# Patient Record
Sex: Male | Born: 1975 | Race: Black or African American | Hispanic: No | Marital: Married | Smoking: Never smoker
Health system: Southern US, Community
[De-identification: ages and names within clinical notes are randomized; demographics above are authoritative.]

## PROBLEM LIST (undated history)

## (undated) DIAGNOSIS — R079 Chest pain, unspecified: Secondary | ICD-10-CM

## (undated) DIAGNOSIS — Z87438 Personal history of other diseases of male genital organs: Secondary | ICD-10-CM

## (undated) DIAGNOSIS — R519 Headache, unspecified: Secondary | ICD-10-CM

## (undated) DIAGNOSIS — R51 Headache: Secondary | ICD-10-CM

## (undated) DIAGNOSIS — R319 Hematuria, unspecified: Secondary | ICD-10-CM

---

## 1998-04-16 ENCOUNTER — Emergency Department (HOSPITAL_COMMUNITY): Admission: EM | Admit: 1998-04-16 | Discharge: 1998-04-16 | Payer: Self-pay | Admitting: Emergency Medicine

## 1998-11-20 ENCOUNTER — Emergency Department (HOSPITAL_COMMUNITY): Admission: EM | Admit: 1998-11-20 | Discharge: 1998-11-20 | Payer: Self-pay | Admitting: Emergency Medicine

## 2000-10-14 ENCOUNTER — Emergency Department: Admission: EM | Admit: 2000-10-14 | Discharge: 2000-10-14 | Payer: Self-pay | Admitting: *Deleted

## 2002-06-04 ENCOUNTER — Emergency Department (HOSPITAL_COMMUNITY): Admission: EM | Admit: 2002-06-04 | Discharge: 2002-06-04 | Payer: Self-pay | Admitting: Emergency Medicine

## 2006-07-31 ENCOUNTER — Emergency Department (HOSPITAL_COMMUNITY): Admission: EM | Admit: 2006-07-31 | Discharge: 2006-07-31 | Payer: Self-pay | Admitting: Family Medicine

## 2006-11-14 ENCOUNTER — Emergency Department (HOSPITAL_COMMUNITY): Admission: EM | Admit: 2006-11-14 | Discharge: 2006-11-14 | Payer: Self-pay | Admitting: Emergency Medicine

## 2006-11-16 ENCOUNTER — Emergency Department (HOSPITAL_COMMUNITY): Admission: EM | Admit: 2006-11-16 | Discharge: 2006-11-16 | Payer: Self-pay | Admitting: Family Medicine

## 2007-06-22 ENCOUNTER — Emergency Department (HOSPITAL_COMMUNITY): Admission: EM | Admit: 2007-06-22 | Discharge: 2007-06-22 | Payer: Self-pay | Admitting: Family Medicine

## 2008-05-04 ENCOUNTER — Emergency Department (HOSPITAL_COMMUNITY): Admission: EM | Admit: 2008-05-04 | Discharge: 2008-05-04 | Payer: Self-pay | Admitting: Emergency Medicine

## 2009-04-08 ENCOUNTER — Emergency Department (HOSPITAL_COMMUNITY): Admission: EM | Admit: 2009-04-08 | Discharge: 2009-04-08 | Payer: Self-pay | Admitting: Family Medicine

## 2011-03-04 LAB — POCT URINALYSIS DIP (DEVICE)
Bilirubin Urine: NEGATIVE
Glucose, UA: NEGATIVE mg/dL
Ketones, ur: NEGATIVE mg/dL
Nitrite: NEGATIVE
Protein, ur: NEGATIVE mg/dL
Specific Gravity, Urine: 1.02 (ref 1.005–1.030)
Urobilinogen, UA: 0.2 mg/dL (ref 0.0–1.0)
pH: 6.5 (ref 5.0–8.0)

## 2011-03-04 LAB — GC/CHLAMYDIA PROBE AMP, GENITAL
Chlamydia, DNA Probe: NEGATIVE
GC Probe Amp, Genital: NEGATIVE

## 2014-09-27 ENCOUNTER — Emergency Department (HOSPITAL_COMMUNITY)
Admission: EM | Admit: 2014-09-27 | Discharge: 2014-09-27 | Disposition: A | Discharge status: Home / Self Care | Payer: Self-pay | Attending: Emergency Medicine | Admitting: Emergency Medicine

## 2014-09-27 ENCOUNTER — Encounter (HOSPITAL_COMMUNITY): Payer: Self-pay | Admitting: *Deleted

## 2014-09-27 DIAGNOSIS — R519 Headache, unspecified: Secondary | ICD-10-CM

## 2014-09-27 DIAGNOSIS — R0981 Nasal congestion: Secondary | ICD-10-CM | POA: Insufficient documentation

## 2014-09-27 DIAGNOSIS — R42 Dizziness and giddiness: Secondary | ICD-10-CM | POA: Insufficient documentation

## 2014-09-27 DIAGNOSIS — R110 Nausea: Secondary | ICD-10-CM | POA: Insufficient documentation

## 2014-09-27 DIAGNOSIS — J3489 Other specified disorders of nose and nasal sinuses: Secondary | ICD-10-CM | POA: Insufficient documentation

## 2014-09-27 DIAGNOSIS — R51 Headache: Secondary | ICD-10-CM | POA: Insufficient documentation

## 2014-09-27 MED ORDER — DIPHENHYDRAMINE HCL 50 MG/ML IJ SOLN
25.0000 mg | Freq: Once | INTRAMUSCULAR | Status: AC
  Administered 2014-09-27: 25 mg via INTRAVENOUS
  Filled 2014-09-27: qty 1

## 2014-09-27 MED ORDER — PROCHLORPERAZINE EDISYLATE 5 MG/ML IJ SOLN
10.0000 mg | Freq: Once | INTRAMUSCULAR | Status: AC
  Administered 2014-09-27: 10 mg via INTRAVENOUS
  Filled 2014-09-27: qty 2

## 2014-09-27 MED ORDER — KETOROLAC TROMETHAMINE 30 MG/ML IJ SOLN
30.0000 mg | INTRAMUSCULAR | Status: AC
  Administered 2014-09-27: 30 mg via INTRAVENOUS
  Filled 2014-09-27: qty 1

## 2014-09-27 MED ORDER — SODIUM CHLORIDE 0.9 % IV BOLUS (SEPSIS)
1000.0000 mL | Freq: Once | INTRAVENOUS | Status: AC
  Administered 2014-09-27: 1000 mL via INTRAVENOUS

## 2014-09-27 NOTE — Discharge Planning (Signed)
[facility] Community Health & Eligibility Specialist  Spoke to patient regarding primary care resources and establishing care with a provider. Orange card application and instructions provided, pt was instructed to contact me once discharged for further instructions. Resource guide and my contact information also given for any future questions or concerns. No other needs identified at this time.

## 2014-09-27 NOTE — Discharge Instructions (Signed)
Please follow the directions provided. Be sure to establish care with a primary care provider to follow-up regarding this headache and for further management for future headaches. Don't hesitate to return for new, worsening, or concerning symptoms.  SEEK IMMEDIATE MEDICAL CARE IF:  Your headache becomes severe.  You have a fever.  You have a stiff neck.  You have loss of vision.  You have muscular weakness or loss of muscle control.  You start losing your balance or have trouble walking.  You feel faint or pass out.  You have severe symptoms that are different from your first symptoms.   Emergency Department Resource Guide 1) Find a Doctor and Pay Out of Pocket Although you won't have to find out who is covered by your insurance plan, it is a good idea to ask around and get recommendations. You will then need to call the office and see if the doctor you have chosen will accept you as a new patient and what types of options they offer for patients who are self-pay. Some doctors offer discounts or will set up payment plans for their patients who do not have insurance, but you will need to ask so you aren't surprised when you get to your appointment.  2) Contact Your Local Health Department Not all health departments have doctors that can see patients for sick visits, but many do, so it is worth a call to see if yours does. If you don't know where your local health department is, you can check in your phone book. The CDC also has a tool to help you locate your state's health department, and many state websites also have listings of all of their local health departments.  3) Find a Walk-in Clinic If your illness is not likely to be very severe or complicated, you may want to try a walk in clinic. These are popping up all over the country in pharmacies, drugstores, and shopping centers. They're usually staffed by nurse practitioners or physician assistants that have been trained to treat common  illnesses and complaints. They're usually fairly quick and inexpensive. However, if you have serious medical issues or chronic medical problems, these are probably not your best option.  No Primary Care Doctor: - Call Health Connect at  [phone] - they can help you locate a primary care doctor that  accepts your insurance, provides certain services, etc. - Physician Referral Service- [phone]  Chronic Pain Problems: Organization         Address  Phone   Notes  [facility]  [phone] Patients need to be referred by their primary care doctor.   Medication Assistance: Organization         Address  Phone   Notes  [facility]ication [facility] [address] [place], [place] [address] [phone]) [phone] --Must be a resident of [facility] [place] -- Must have NO insurance coverage whatsoever (no Medicaid/ Medicare, etc.) -- The pt. MUST have a primary care doctor that directs their care regularly and follows them in the community   MedAssist  [phone]   [employer]  [phone]) [phone]    Agencies that provide inexpensive medical care: Organization         Address  Phone   Notes  [facility]  [phone]) [phone]   [facility]    [phone]   [facility] [address] [place], [place] [address] [phone]   [facility] [address] [place] [phone]   Planned Parenthood    [phone]   [facility]    [phone]   [facility] and [facility]  [address], [place] Phone:  [phone], Fax:  [phone] Hours of Operation:  9 am - 6 pm, M-F.  Also accepts Medicaid/Medicare and self-pay.  [facility]  [address], [place] Phone: [phone], Fax: [phone]. Hours of Operation:  8:30 am - 5:30 pm, M-F.  Also accepts Medicaid and self-pay.  [facility] [place]  [address], [place] Phone: [phone]   [facility] [address] Natasha Bence [place], [place] [phone], Ext. [phone] Mondays & Thursdays: 7-9 AM.  First 15 patients are seen on a first come, first serve basis.    Medicaid-accepting [facility] Providers:  Organization         Address  Phone   Notes  [facility] [address], [place] [phone] Also accepts self-pay patients.  [facility] [address] Laurell Josephs [place], [place]  [phone]   [facility] [address], [place] [phone]   [facility] [address] [place] [phone]   Renaye Rakers [address], [place]   [phone] Only accepts [place] Access [place] patients after they have their name applied to their card.   Self-Pay (no insurance) in [facility]:  Organization         Address  Phone   Notes  Sickle Cell Patients, [facility] [address] [place], [place] [phone]   [facility] [address] [place], [place] [phone]   [facility]  [address], [place] [phone]   [facility]/Dr. Osei-Bonsu  [address] [place] or [address], [place] [phone] Phone number for both [place] and [place] locations is the same.  [facility] [address], [place] [phone]   [facility] [address] [place] or [address] [phone]   [facility] [address] [place] [phone], phone; [phone], fax Sees patients 1st and 3rd Saturday of every month.  Must not qualify for public or private insurance (i.e. Medicaid, Medicare, [place] Health Choice, Veterans' Benefits)  Household income should be no more than 200% of the  poverty level The clinic cannot treat you if you are pregnant or think you are pregnant  Sexually transmitted diseases are not treated at the clinic.    Dental Care: Organization         Address  Phone  Notes  [facility] [address] [place], [place] [phone] Accepts children up to age 22 who are enrolled in [place] or [place] Health Choice; pregnant women with a Medicaid card; and children who have applied for Medicaid or [place] Health Choice, but were declined, whose parents can pay a reduced fee at time of service.  [facility]  [address], [place] [phone] Accepts children up to age 36 who are enrolled in [place] or  Health Choice; pregnant women with a Medicaid card; and children who  have applied for Medicaid or [place] Health Choice, but were declined, whose parents can pay a reduced fee at time of service.  [facility]  [address], [place] [phone] Patients are seen by appointment only. Walk-ins are not accepted. [facility] will see patients 40 years of age and older. Monday - Tuesday (8am-5pm) Most Wednesdays (8:30-5pm) $30 per visit, cash only  [facility]  [address], [facility] [place] [phone] Patients are seen by appointment only. Walk-ins are not accepted. [facility] will see patients 40 years of age and older. One Wednesday Evening (Monthly: Volunteer Based).  $30 per visit, cash only  [employer] [facility]  [phone] for adults; Children under age 674, call Graduate Pediatric Dentistry at [phone]) [phone]. Children aged 344-14, please call [phone] to request a pediatric application.  Dental services are provided in all areas of dental care including fillings, crowns and bridges, complete and partial dentures, implants, gum treatment, root canals, and extractions.  Preventive care is also provided. Treatment is provided to both adults and children. Patients are selected via a lottery and there is often a waiting list.   [facility] [address], [place]  [phone] [email or web address]   [facility] [address], [place], [place] [phone], Ext. [phone] Second and Fourth Thursday of each month, opens at 6:30 AM; Clinic ends at 9 AM.  Patients are seen on a first-come first-served basis, and a limited number are seen during each clinic.   [facility]  [address], [place], [place] [phone]   Eligibility Requirements You must have lived in [place], [place], or [place] counties for at least the last three months.   You cannot be eligible for state or federal sponsored National Cityhealthcare insurance, including CIGNAVeterans Administration, [place]icaid, or [employer]Medicare.   You generally cannot be eligible for healthcare insurance through your employer.    How to apply: Eligibility screenings are held every Tuesday and Wednesday afternoon from 1:00 pm until 4:00 pm. You do not need an appointment for the interview!  [facility] [address], [place], [place] [phone]   [facility]  [phone]   [facility]  [phone]   [facility]  [phone]    Behavioral Health Resources in the Community: Intensive Outpatient Programs Organization         Address  Phone  Notes  [facility] [address], [place], [place] [phone]   [facility] [address], [place], [place] [phone]   [facility] [address], [place], [place]  [phone]   [facility] [address],  [place], [place] [phone] or [phone]   Substance Abuse Resources Organization         Address  Phone  Notes  [facility] Drug Services  [phone]   Addiction  [facility]  [phone]   [facility]  [phone]   [facility]  [phone]   Residential & Outpatient Substance Abuse Program  [phone]   Psychological Services Organization         Address  Phone  Notes  [facility]  [phone]- [phone]   [facility]  [phone]- [phone]   [facility] [address], [place] [phone] or [phone]    Mobile Crisis Teams Organization  Address  Phone  Notes  [facility], Mobile Crisis Care Unit  [phone]   [facility]  [address] [place] [address] [place], [place]   [facility] [address] [place] [phone]    Self-Help/Support Groups Organization         Address  Phone             Notes  [place]. of [place] - variety of support groups  [place] Call for more information  Narcotics Anonymous (NA), [facility] [address], [employer] [place]  2 meetings at this location   Special educational needs teacher         Address  Phone  Notes  [facility] Residential Treatment [place],    [place]  [phone]   [facility]  [address], [place] [address]68, [place], [place]   [place], [place] [phone] Admissions: 8am-3pm M-F  Incentives Substance [place] [address],    [place], [place] [phone]   [facility] [address] [place], [place], [place]   [facility] [address]  [place], [place]   [facility] - Intensive Outpatient [address]een [address], [place], [place]   [facility] ([address] [place].,  [place], [place] [phone] or [phone]   [facility] (RTS) [address], [place], [place] Accepts Medicaid  [facility] [address],  [place]  [phone] Substance Abuse/Addiction Treatment   [facility] Organization         Address  Phone  Notes  [facility]  [phone]   Domenic Schwab, PhD [address] Arlis Porta [place], [place]   [phone] or [phone]   [address] [place], [place] [phone]   [facility] [address] [place], [place] [phone] Insurance/Medicaid/sponsorship through [facility] [address], [address]                                    [place], [place] [phone] [place] [address], [place] [phone]    Dr. Adele Schilder  [phone]   [facility] 1) [address], [place] 2) [place] 3)  [address], [place] [phone]  [phone]   [place] [phone] or [phone] (After Hours)

## 2014-09-27 NOTE — ED Notes (Signed)
pts iv removed pt awaiting discharge paperwork at bedside.

## 2014-09-27 NOTE — ED Notes (Signed)
Pt reports headache x 3 days, no relief with otc meds but denies any n/v.

## 2014-09-27 NOTE — ED Provider Notes (Signed)
CSN: [id number]     Arrival date & time 09/27/14  0907 History  This chart was scribed for non-physician practitioner, Harle BattiestElizabeth Asani Mcburney, PA-C,working with Purvis SheffieldForrest Harrison, MD, by Karle PlumberJennifer Tensley, ED Scribe. This patient was seen in room TR05C/TR05C and the patient's care was started at 10:12 AM.  Chief Complaint  Patient presents with  . Headache   Patient is a 38 y.o. male presenting with headaches. The history is provided by the patient. No language interpreter was used.  Headache Associated symptoms: congestion, dizziness and nausea   Associated symptoms: no cough, no sore throat and no vomiting     HPI Comments:  Melanie CrazierDonte Flynn is a 38 y.o. male who presents to the Emergency Department complaining of a severe headache that began three days ago. He states the pain began gradually while he was washing dishes at home and is more prominent on the right side. He reports the pain is so bad it makes his eyes water. He reports associated congestion, rhinorrhea, nausea and dizziness. He has taken Aleve with no significant relief of the pain. No modifying factors reported. He denies vomiting, sore throat, cough, LOC, confusion, visual changes, weakness or inability to walk.  History reviewed. No pertinent past medical history. History reviewed. No pertinent past surgical history. History reviewed. No pertinent family history. History  Substance Use Topics  . Smoking status: Not on file  . Smokeless tobacco: Not on file  . Alcohol Use: No    Review of Systems  HENT: Positive for congestion and rhinorrhea. Negative for sore throat.   Eyes: Negative for visual disturbance.  Respiratory: Negative for cough.   Gastrointestinal: Positive for nausea. Negative for vomiting.  Musculoskeletal: Negative for gait problem.  Neurological: Positive for dizziness and headaches. Negative for syncope and weakness.  Psychiatric/Behavioral: Negative for confusion.    Allergies  Review of patient's  allergies indicates no known allergies.  Home Medications   Prior to Admission medications   Not on File   Triage Vitals: BP 115/87 mmHg  Pulse 78  Temp(Src) 98.3 F (36.8 C) (Oral)  Resp 18  SpO2 99% Physical Exam  Constitutional: He is oriented to person, place, and time. He appears well-developed and well-nourished.  HENT:  Head: Normocephalic and atraumatic.  Tender to palpation circumferentially around scalp.  Eyes: EOM are normal. Pupils are equal, round, and reactive to light.  Neck: Normal range of motion.  Cardiovascular: Normal rate.   Pulmonary/Chest: Effort normal.  Musculoskeletal: Normal range of motion. He exhibits tenderness.  Lymphadenopathy:    He has no cervical adenopathy.  Neurological: He is alert and oriented to person, place, and time. No cranial nerve deficit.  Cranial nerves 2-12 grossly intact. Grip strength and SLR 5/5 bilaterally.  Skin: Skin is warm and dry.  Psychiatric: He has a normal mood and affect. His behavior is normal.  Nursing note and vitals reviewed.   ED Course  Procedures (including critical care time) DIAGNOSTIC STUDIES: Oxygen Saturation is 99% on RA, normal by my interpretation.   COORDINATION OF CARE: 10:17 AM- Will start IV and give Toradol, Compazine and Benadryl. Pt verbalizes understanding and agrees to plan.  Labs Review Labs Reviewed - No data to display  Imaging Review No results found.   EKG Interpretation None      MDM   Final diagnoses:  Nonintractable headache, unspecified chronicity pattern, unspecified headache type   38 yo male with headache non-concerning for [facility], ICH, meningitis or temporal arteritis.  Presentation is like pts typical HA.  Pt is afebrile with no focal neuro deficits, nuchal rigidity, or change in vision. Pain managed in the ED. Discharge instructions include resources to establish care with a PCP to discuss prophylactic medication. Pt verbalizes understanding and is agreeable with  plan to dc.    I personally performed the services described in this documentation, which was scribed in my presence. The recorded information has been reviewed and is accurate.  Filed Vitals:   09/27/14 0922 09/27/14 1328 09/27/14 1340  BP: 115/87 88/55 105/88  Pulse: 78 66   Temp: 98.3 F (36.8 C)    TempSrc: Oral    Resp: 18 18   SpO2: 99% 99%     Meds given in ED:  Medications  sodium chloride 0.9 % bolus 1,000 mL (0 mLs Intravenous Stopped 09/27/14 1356)  ketorolac (TORADOL) 30 MG/ML injection 30 mg (30 mg Intravenous Given 09/27/14 1120)  prochlorperazine (COMPAZINE) injection 10 mg (10 mg Intravenous Given 09/27/14 1127)  diphenhydrAMINE (BENADRYL) injection 25 mg (25 mg Intravenous Given 09/27/14 1121)    There are no discharge medications for this patient.     Harle BattiestElizabeth Anaisa Radi, NP 09/28/14 2241  Purvis SheffieldForrest Harrison, MD 09/29/14 1416

## 2014-10-06 ENCOUNTER — Encounter (HOSPITAL_COMMUNITY): Payer: Self-pay | Admitting: Emergency Medicine

## 2014-10-06 ENCOUNTER — Emergency Department (HOSPITAL_COMMUNITY)
Admission: EM | Admit: 2014-10-06 | Discharge: 2014-10-07 | Disposition: A | Discharge status: Home / Self Care | Payer: Self-pay | Attending: Emergency Medicine | Admitting: Emergency Medicine

## 2014-10-06 ENCOUNTER — Emergency Department (HOSPITAL_COMMUNITY): Payer: Self-pay

## 2014-10-06 DIAGNOSIS — Z79899 Other long term (current) drug therapy: Secondary | ICD-10-CM | POA: Insufficient documentation

## 2014-10-06 DIAGNOSIS — Z7982 Long term (current) use of aspirin: Secondary | ICD-10-CM | POA: Insufficient documentation

## 2014-10-06 DIAGNOSIS — R0602 Shortness of breath: Secondary | ICD-10-CM | POA: Insufficient documentation

## 2014-10-06 DIAGNOSIS — Z87438 Personal history of other diseases of male genital organs: Secondary | ICD-10-CM | POA: Insufficient documentation

## 2014-10-06 DIAGNOSIS — R079 Chest pain, unspecified: Secondary | ICD-10-CM | POA: Insufficient documentation

## 2014-10-06 DIAGNOSIS — Z87448 Personal history of other diseases of urinary system: Secondary | ICD-10-CM | POA: Insufficient documentation

## 2014-10-06 DIAGNOSIS — I4510 Unspecified right bundle-branch block: Secondary | ICD-10-CM | POA: Insufficient documentation

## 2014-10-06 DIAGNOSIS — J9811 Atelectasis: Secondary | ICD-10-CM | POA: Insufficient documentation

## 2014-10-06 DIAGNOSIS — R51 Headache: Secondary | ICD-10-CM | POA: Insufficient documentation

## 2014-10-06 HISTORY — DX: Hematuria, unspecified: R31.9

## 2014-10-06 HISTORY — DX: Headache: R51

## 2014-10-06 HISTORY — DX: Headache, unspecified: R51.9

## 2014-10-06 HISTORY — DX: Chest pain, unspecified: R07.9

## 2014-10-06 HISTORY — DX: Personal history of other diseases of male genital organs: Z87.438

## 2014-10-06 LAB — BASIC METABOLIC PANEL
Anion gap: 12 (ref 5–15)
BUN: 13 mg/dL (ref 6–23)
CO2: 25 meq/L (ref 19–32)
CREATININE: 1.44 mg/dL — AB (ref 0.50–1.35)
Calcium: 9.5 mg/dL (ref 8.4–10.5)
Chloride: 102 mEq/L (ref 96–112)
GFR calc Af Amer: 69 mL/min — ABNORMAL LOW (ref >90)
GFR calc non Af Amer: 60 mL/min — ABNORMAL LOW (ref >90)
GLUCOSE: 83 mg/dL (ref 70–99)
Potassium: 3.9 mEq/L (ref 3.7–5.3)
Sodium: 139 mEq/L (ref 137–147)

## 2014-10-06 LAB — CBC
HCT: 38.2 % — ABNORMAL LOW (ref 39.0–52.0)
Hemoglobin: 12.9 g/dL — ABNORMAL LOW (ref 13.0–17.0)
MCH: 30.1 pg (ref 26.0–34.0)
MCHC: 33.8 g/dL (ref 30.0–36.0)
MCV: 89.3 fL (ref 78.0–100.0)
Platelets: 322 10*3/uL (ref 150–400)
RBC: 4.28 MIL/uL (ref 4.22–5.81)
RDW: 12.1 % (ref 11.5–15.5)
WBC: 5.2 10*3/uL (ref 4.0–10.5)

## 2014-10-06 LAB — I-STAT TROPONIN, ED: Troponin i, poc: 0 ng/mL (ref 0.00–0.08)

## 2014-10-06 NOTE — ED Provider Notes (Signed)
CSN: [id number]     Arrival date & time 10/06/14  1921 History   First MD Initiated Contact with Patient 10/06/14 2232     Chief Complaint  Patient presents with  . Chest Pain     (Consider location/radiation/quality/duration/timing/severity/associated sxs/prior Treatment) HPI Comments: This is a 38 y/o male who presents to the Emergency Department with his wife complaining of intermittent chest pain x 3-6 months. His chest pain is described as a squeezing pressure in the left side of his chest with associated shortness of breath that lasts 15-20 minutes. He states that occasionally he will get "hot flashes" with his chest pain episodes. Around 3am today, he was awoken from sleep with chest pain. He states he took a shower and his wife gave him one aspirin and one naproxen and he then fell back asleep. He later went to pick up his wife from work at a doctors's office and she performed an EKG where a RBBB was found and it was recommended by the doctor there to go to the ED. Pt reports he would not have come to the ED if his wife did not bring him. Non-smoker. His sister has a hx of early heart disease. He does a lot of weight lifting and is unsure if that could be the cause of his pain. Denies numbness, weakness, n/v.  Patient is a 38 y.o. male presenting with chest pain. The history is provided by the patient and the spouse.  Chest Pain   Past Medical History  Diagnosis Date  . Chest pain   . History of penile discharge   . Hematuria   . Headache    History reviewed. No pertinent past surgical history. No family history on file. History  Substance Use Topics  . Smoking status: Not on file  . Smokeless tobacco: Not on file  . Alcohol Use: No    Review of Systems  10 Systems reviewed and are negative for acute change except as noted in the HPI.  Allergies  Review of patient's allergies indicates no known allergies.  Home Medications   Prior to Admission medications    Medication Sig Start Date End Date Taking? Authorizing Provider  aspirin 325 MG tablet Take 325 mg by mouth once.    Yes Historical Provider, MD  HYDROcodone-acetaminophen (NORCO/VICODIN) 5-325 MG per tablet Take 1 tablet by mouth every 6 (six) hours as needed for moderate pain.   Yes Historical Provider, MD   BP 132/82 mmHg  Pulse 67  Temp(Src) 98.4 F (36.9 C)  Resp 26  Wt 209 lb 4 oz (94.915 kg)  SpO2 98% Physical Exam  Constitutional: He is oriented to person, place, and time. He appears well-developed and well-nourished. No distress.  HENT:  Head: Normocephalic and atraumatic.  Mouth/Throat: Oropharynx is clear and moist.  Eyes: Conjunctivae and EOM are normal. Pupils are equal, round, and reactive to light.  Neck: Normal range of motion. Neck supple. No JVD present.  Cardiovascular: Normal rate, regular rhythm, normal heart sounds and intact distal pulses.   No extremity edema.  Pulmonary/Chest: Effort normal and breath sounds normal. No respiratory distress.  Abdominal: Soft. Bowel sounds are normal. There is no tenderness.  Musculoskeletal: Normal range of motion. He exhibits no edema.  Neurological: He is alert and oriented to person, place, and time. He has normal strength. No sensory deficit.  Speech fluent, goal oriented. Moves limbs without ataxia. Equal grip strength bilateral.  Skin: Skin is warm and dry. He is not diaphoretic.  Psychiatric: He has a normal mood and affect. His behavior is normal.  Nursing note and vitals reviewed.   ED Course  Procedures (including critical care time) Labs Review Labs Reviewed  CBC - Abnormal; Notable for the following:    Hemoglobin 12.9 (*)    HCT 38.2 (*)    All other components within normal limits  BASIC METABOLIC PANEL - Abnormal; Notable for the following:    Creatinine, Ser 1.44 (*)    GFR calc non Af Amer 60 (*)    GFR calc Af Amer 69 (*)    All other components within normal limits  I-STAT TROPOININ, ED     Imaging Review Dg Chest 2 View  10/06/2014   CLINICAL DATA:  All over chest pain with cough and shortness of breath. Sore throat for 1 week.  EXAM: CHEST  2 VIEW  COMPARISON:  Chest radiograph 05/04/2008  FINDINGS: The heart size and mediastinal contours are within normal limits. There is streaky left basilar atelectasis. Otherwise, the lungs are clear. Negative for pneumothorax or pleural effusion. The visualized skeletal structures are unremarkable.  IMPRESSION: Mild left basal atelectasis. Otherwise, no acute findings identified.   Electronically Signed   By: Britta MccreedySusan  Turner M.D.   On: 10/06/2014 20:14     EKG Interpretation   Date/Time:  Friday October 06 2014 19:22:36 EST Ventricular Rate:  74 PR Interval:  148 QRS Duration: 98 QT Interval:  370 QTC Calculation: 410 R Axis:   -31 Text Interpretation:  Normal sinus rhythm Left axis deviation Incomplete  right bundle branch block Abnormal ECG No old tracing to compare Confirmed  by GOLDSTON  MD, SCOTT ([id number]) on 10/06/2014 10:30:49 PM      MDM   Final diagnoses:  Chest pain, unspecified chest pain type   Patient presenting with chest pain over the past 3-6 months. He is nontoxic appearing and in no apparent distress. Vital signs stable. EKG showing normal sinus rhythm with left axis deviation, incomplete right bundle branch block. No old EKG to compare. Troponin negative. Chest x-ray without any acute findings. Mild left basilar atelectasis. Doubt cardiac. Symptoms have been ongoing for months. Low risk heart score 2. PERC negative. Resources given for PCP follow-up. His wife states she works with cardiologists who she will try to get him an appointment with. He is stable for discharge. Return precautions given. Patient states understanding of treatment care plan and is agreeable.  Kathrynn SpeedRobyn M Dejay Kronk, PA-C 10/06/14 2357  Audree CamelScott T Goldston, MD 10/07/14 [phone]

## 2014-10-06 NOTE — ED Notes (Signed)
Pt reports chest intermittent chest pain x 6 moths. Denies CP at this time. Denies nausea, vomiting, dizziness. Pain does not increase with movement or activity. Noting makes it better. Pt alert and oriented x 4, neuro intact. Pt is hard of hearing.

## 2014-10-06 NOTE — ED Notes (Signed)
Pt reports sob for months and early this morning he was waken up with mid to left sided chest pressure radiating to arms. Pt also reports headaches. Pt seen at PCP today and was told he had an abnormal ekg. No distress noted. Skin warm and dry. resp e/u.

## 2014-10-06 NOTE — Discharge Instructions (Signed)

## 2015-04-14 ENCOUNTER — Emergency Department (HOSPITAL_COMMUNITY)
Admission: EM | Admit: 2015-04-14 | Discharge: 2015-04-14 | Disposition: A | Discharge status: Home / Self Care | Payer: BLUE CROSS/BLUE SHIELD | Attending: Emergency Medicine | Admitting: Emergency Medicine

## 2015-04-14 ENCOUNTER — Encounter (HOSPITAL_COMMUNITY): Payer: Self-pay | Admitting: Emergency Medicine

## 2015-04-14 DIAGNOSIS — Z7982 Long term (current) use of aspirin: Secondary | ICD-10-CM | POA: Diagnosis not present

## 2015-04-14 DIAGNOSIS — M5412 Radiculopathy, cervical region: Secondary | ICD-10-CM | POA: Diagnosis not present

## 2015-04-14 DIAGNOSIS — Z87448 Personal history of other diseases of urinary system: Secondary | ICD-10-CM | POA: Insufficient documentation

## 2015-04-14 DIAGNOSIS — R531 Weakness: Secondary | ICD-10-CM | POA: Diagnosis present

## 2015-04-14 MED ORDER — IBUPROFEN 600 MG PO TABS: 600.0000 mg | ORAL_TABLET | Freq: Four times a day (QID) | ORAL | Status: DC | PRN

## 2015-04-14 MED ORDER — HYDROCODONE-ACETAMINOPHEN 5-325 MG PO TABS: 1.0000 | ORAL_TABLET | Freq: Four times a day (QID) | ORAL | Status: DC | PRN

## 2015-04-14 MED ORDER — CYCLOBENZAPRINE HCL 10 MG PO TABS: 10.0000 mg | ORAL_TABLET | Freq: Two times a day (BID) | ORAL | Status: DC | PRN

## 2015-04-14 NOTE — ED Notes (Signed)
MD Beaton at bedside. 

## 2015-04-14 NOTE — ED Provider Notes (Signed)
CSN: [id number]     Arrival date & time 04/14/15  1210 History   First MD Initiated Contact with Patient 04/14/15 1216     Chief Complaint  Patient presents with  . Extremity Weakness    right arm      HPI Patient presents with pain to right arm especially with abduction.  Patient noted numbness this morning when he woke up.  Felt it was somewhat weak.  Denies any speech problems denies any leg problems.  Denies fever chills. Past Medical History  Diagnosis Date  . Chest pain   . History of penile discharge   . Hematuria   . Headache    History reviewed. No pertinent past surgical history. History reviewed. No pertinent family history. History  Substance Use Topics  . Smoking status: Never Smoker   . Smokeless tobacco: Not on file  . Alcohol Use: No    Review of Systems  All other systems reviewed and are negative  Allergies  Review of patient's allergies indicates no known allergies.  Home Medications   Prior to Admission medications   Medication Sig Start Date End Date Taking? Authorizing Provider  aspirin 325 MG tablet Take 325 mg by mouth once.     Historical Provider, MD  cyclobenzaprine (FLEXERIL) 10 MG tablet Take 1 tablet (10 mg total) by mouth 2 (two) times daily as needed for muscle spasms. 04/14/15   Nelva Nayobert Milinda Sweeney, MD  HYDROcodone-acetaminophen (NORCO/VICODIN) 5-325 MG per tablet Take 1 tablet by mouth every 6 (six) hours as needed for moderate pain. 04/14/15   Nelva Nayobert Jakory Matsuo, MD  ibuprofen (ADVIL,MOTRIN) 600 MG tablet Take 1 tablet (600 mg total) by mouth every 6 (six) hours as needed. 04/14/15   Nelva Nayobert Benton Tooker, MD   BP 169/91 mmHg  Pulse 93  Temp(Src) 98.5 F (36.9 C) (Oral)  Resp 17  Ht 5\' 11"  (1.803 m)  Wt 198 lb (89.812 kg)  BMI 27.63 kg/m2  SpO2 100% Physical Exam  Neck:     Physical Exam  Nursing note and vitals reviewed. Constitutional: He is oriented to person, place, and time. He appears well-developed and well-nourished. No distress.    HENT:  Head: Normocephalic and atraumatic.  Eyes: Pupils are equal, round, and reactive to light.  Neck: Tenderness to palpation were indicated on chart.   Cardiovascular: Normal rate and intact distal pulses.   Pulmonary/Chest: No respiratory distress.  Abdominal: Normal appearance. He exhibits no distension.  Musculoskeletal: Normal range of motion.  Neurological: He is alert and oriented to person, place, and time. No cranial nerve deficit.  Gait is normal.  GCS = 15.  Sensory-no deficits noted.  Cerebellar negative.  Cranial nerves II through XII are negative.   Skin: Skin is warm and dry. No rash noted.  Psychiatric: He has a normal mood and affect. His behavior is normal.   ED Course  Procedures (including critical care time) Labs Review Labs Reviewed - No data to display     MDM   Final diagnoses:  Cervical radiculopathy        Nelva Nayobert Treon Kehl, MD 04/14/15 1355

## 2015-04-14 NOTE — ED Notes (Addendum)
Patient coming from home with right arm pain x 1 week.  Patient states the arm will be numb and the hand has "pins and needles feeling".  Denies injury to the right arm.

## 2015-04-14 NOTE — Discharge Instructions (Signed)

## 2015-05-25 ENCOUNTER — Emergency Department (HOSPITAL_COMMUNITY)
Admission: EM | Admit: 2015-05-25 | Discharge: 2015-05-25 | Disposition: A | Discharge status: Home / Self Care | Payer: BLUE CROSS/BLUE SHIELD | Attending: Emergency Medicine | Admitting: Emergency Medicine

## 2015-05-25 ENCOUNTER — Encounter (HOSPITAL_COMMUNITY): Payer: Self-pay

## 2015-05-25 DIAGNOSIS — Z23 Encounter for immunization: Secondary | ICD-10-CM | POA: Insufficient documentation

## 2015-05-25 DIAGNOSIS — Y9289 Other specified places as the place of occurrence of the external cause: Secondary | ICD-10-CM | POA: Diagnosis not present

## 2015-05-25 DIAGNOSIS — X58XXXA Exposure to other specified factors, initial encounter: Secondary | ICD-10-CM | POA: Diagnosis not present

## 2015-05-25 DIAGNOSIS — Y998 Other external cause status: Secondary | ICD-10-CM | POA: Diagnosis not present

## 2015-05-25 DIAGNOSIS — S0591XA Unspecified injury of right eye and orbit, initial encounter: Secondary | ICD-10-CM | POA: Diagnosis present

## 2015-05-25 DIAGNOSIS — Y9389 Activity, other specified: Secondary | ICD-10-CM | POA: Diagnosis not present

## 2015-05-25 MED ORDER — ERYTHROMYCIN 5 MG/GM OP OINT
TOPICAL_OINTMENT | Freq: Once | OPHTHALMIC | Status: AC
  Administered 2015-05-25: 1 via OPHTHALMIC
  Filled 2015-05-25: qty 3.5

## 2015-05-25 MED ORDER — TETRACAINE HCL 0.5 % OP SOLN
2.0000 [drp] | Freq: Once | OPHTHALMIC | Status: AC
  Administered 2015-05-25: 2 [drp] via OPHTHALMIC
  Filled 2015-05-25: qty 2

## 2015-05-25 MED ORDER — FLUORESCEIN SODIUM 1 MG OP STRP
1.0000 | ORAL_STRIP | Freq: Once | OPHTHALMIC | Status: AC
  Administered 2015-05-25: 1 via OPHTHALMIC
  Filled 2015-05-25: qty 1

## 2015-05-25 MED ORDER — TETANUS-DIPHTH-ACELL PERTUSSIS 5-2.5-18.5 LF-MCG/0.5 IM SUSP
0.5000 mL | Freq: Once | INTRAMUSCULAR | Status: AC
  Administered 2015-05-25: 0.5 mL via INTRAMUSCULAR
  Filled 2015-05-25: qty 0.5
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
## 2015-05-25 NOTE — ED Notes (Signed)
Pt presents with c/o right eye irritation. Pt reports he was weed eating and he believes he may have gotten a small rock in his eye. Pt's eye is very irritation and red at this time, reporting some blurred vision.

## 2015-05-25 NOTE — Discharge Instructions (Signed)
Apply ointment to the lower eyelid of your eye every 6-8 hrs while awake for the next 5 days.  Take tylenol or ibuprofen at home for pain.  Follow up with eye specialist for further care.  Return to ER if your condition worsen or if you have other concerns.  Corneal Abrasion The cornea is the clear covering at the front and center of the eye. When looking at the colored portion of the eye (iris), you are looking through the cornea. This very thin tissue is made up of many layers. The surface layer is a single layer of cells (corneal epithelium) and is one of the most sensitive tissues in the body. If a scratch or injury causes the corneal epithelium to come off, it is called a corneal abrasion. If the injury extends to the tissues below the epithelium, the condition is called a corneal ulcer. CAUSES   Scratches.  Trauma.  Foreign body in the eye. Some people have recurrences of abrasions in the area of the original injury even after it has healed (recurrent erosion syndrome). Recurrent erosion syndrome generally improves and goes away with time. SYMPTOMS   Eye pain.  Difficulty or inability to keep the injured eye open.  The eye becomes very sensitive to light.  Recurrent erosions tend to happen suddenly, first thing in the morning, usually after waking up and opening the eye. DIAGNOSIS  Your health care provider can diagnose a corneal abrasion during an eye exam. Dye is usually placed in the eye using a drop or a small paper strip moistened by your tears. When the eye is examined with a special light, the abrasion shows up clearly because of the dye. TREATMENT   Small abrasions may be treated with antibiotic drops or ointment alone.  A pressure patch may be put over the eye. If this is done, follow your doctor's instructions for when to remove the patch. Do not drive or use machines while the eye patch is on. Judging distances is hard to do with a patch on. If the abrasion becomes  infected and spreads to the deeper tissues of the cornea, a corneal ulcer can result. This is serious because it can cause corneal scarring. Corneal scars interfere with light passing through the cornea and cause a loss of vision in the involved eye. HOME CARE INSTRUCTIONS  Use medicine or ointment as directed. Only take over-the-counter or prescription medicines for pain, discomfort, or fever as directed by your health care provider.  Do not drive or operate machinery if your eye is patched. Your ability to judge distances is impaired.  If your health care provider has given you a follow-up appointment, it is very important to keep that appointment. Not keeping the appointment could result in a severe eye infection or permanent loss of vision. If there is any problem keeping the appointment, let your health care provider know. SEEK MEDICAL CARE IF:   You have pain, light sensitivity, and a scratchy feeling in one eye or both eyes.  Your pressure patch keeps loosening up, and you can blink your eye under the patch after treatment.  Any kind of discharge develops from the eye after treatment or if the lids stick together in the morning.  You have the same symptoms in the morning as you did with the original abrasion days, weeks, or months after the abrasion healed. MAKE SURE YOU:   Understand these instructions.  Will watch your condition.  Will get help right away if you are not  doing well or get worse. Document Released: 11/07/2000 Document Revised: 11/15/2013 Document Reviewed: 07/18/2013 [facility] Patient Information 2015 [place], [place]. This information is not intended to replace advice given to you by your health care provider. Make sure you discuss any questions you have with your health care provider.

## 2015-08-14 ENCOUNTER — Encounter (HOSPITAL_COMMUNITY): Payer: Self-pay

## 2015-08-14 DIAGNOSIS — S59901A Unspecified injury of right elbow, initial encounter: Secondary | ICD-10-CM | POA: Diagnosis present

## 2015-08-14 DIAGNOSIS — S40812A Abrasion of left upper arm, initial encounter: Secondary | ICD-10-CM | POA: Insufficient documentation

## 2015-08-14 DIAGNOSIS — Y9389 Activity, other specified: Secondary | ICD-10-CM | POA: Diagnosis not present

## 2015-08-14 DIAGNOSIS — S53401A Unspecified sprain of right elbow, initial encounter: Secondary | ICD-10-CM | POA: Diagnosis not present

## 2015-08-14 DIAGNOSIS — S8391XA Sprain of unspecified site of right knee, initial encounter: Secondary | ICD-10-CM | POA: Diagnosis not present

## 2015-08-14 DIAGNOSIS — S50812A Abrasion of left forearm, initial encounter: Secondary | ICD-10-CM | POA: Diagnosis not present

## 2015-08-14 DIAGNOSIS — S40811A Abrasion of right upper arm, initial encounter: Secondary | ICD-10-CM | POA: Diagnosis not present

## 2015-08-14 DIAGNOSIS — Y998 Other external cause status: Secondary | ICD-10-CM | POA: Insufficient documentation

## 2015-08-14 DIAGNOSIS — S80211A Abrasion, right knee, initial encounter: Secondary | ICD-10-CM | POA: Diagnosis not present

## 2015-08-14 DIAGNOSIS — Z87438 Personal history of other diseases of male genital organs: Secondary | ICD-10-CM | POA: Insufficient documentation

## 2015-08-14 DIAGNOSIS — Y92410 Unspecified street and highway as the place of occurrence of the external cause: Secondary | ICD-10-CM | POA: Insufficient documentation
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
## 2015-08-15 ENCOUNTER — Emergency Department (HOSPITAL_COMMUNITY)
Admission: EM | Admit: 2015-08-15 | Discharge: 2015-08-15 | Disposition: A | Discharge status: Home / Self Care | Payer: BLUE CROSS/BLUE SHIELD | Attending: Emergency Medicine | Admitting: Emergency Medicine

## 2015-08-15 ENCOUNTER — Emergency Department (HOSPITAL_COMMUNITY): Payer: BLUE CROSS/BLUE SHIELD

## 2015-08-15 DIAGNOSIS — S8391XA Sprain of unspecified site of right knee, initial encounter: Secondary | ICD-10-CM

## 2015-08-15 DIAGNOSIS — S53401A Unspecified sprain of right elbow, initial encounter: Secondary | ICD-10-CM

## 2015-08-15 DIAGNOSIS — T07XXXA Unspecified multiple injuries, initial encounter: Secondary | ICD-10-CM

## 2015-08-15 MED ORDER — OXYCODONE-ACETAMINOPHEN 5-325 MG PO TABS
2.0000 | ORAL_TABLET | Freq: Once | ORAL | Status: AC
  Administered 2015-08-15: 2 via ORAL
  Filled 2015-08-15: qty 2
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
## 2015-08-15 NOTE — Discharge Instructions (Signed)

## 2015-08-15 NOTE — Progress Notes (Signed)
Orthopedic Tech Progress Note Patient Details:  Lance Gomez 1976/03/14 [id number]  Ortho Devices Type of Ortho Device: Arm sling Ortho Device/Splint Location: RUE Ortho Device/Splint Interventions: Application   Asia R Thompson 08/15/2015, 4:31 AM

## 2015-11-14 ENCOUNTER — Encounter (HOSPITAL_COMMUNITY): Payer: Self-pay | Admitting: *Deleted

## 2015-11-14 ENCOUNTER — Emergency Department (HOSPITAL_COMMUNITY): Payer: BLUE CROSS/BLUE SHIELD

## 2015-11-14 ENCOUNTER — Emergency Department (HOSPITAL_COMMUNITY)
Admission: EM | Admit: 2015-11-14 | Discharge: 2015-11-14 | Disposition: A | Discharge status: Home / Self Care | Payer: BLUE CROSS/BLUE SHIELD | Attending: Emergency Medicine | Admitting: Emergency Medicine

## 2015-11-14 DIAGNOSIS — Z87438 Personal history of other diseases of male genital organs: Secondary | ICD-10-CM | POA: Insufficient documentation

## 2015-11-14 DIAGNOSIS — K648 Other hemorrhoids: Secondary | ICD-10-CM | POA: Insufficient documentation

## 2015-11-14 DIAGNOSIS — Z79899 Other long term (current) drug therapy: Secondary | ICD-10-CM | POA: Insufficient documentation

## 2015-11-14 DIAGNOSIS — K649 Unspecified hemorrhoids: Secondary | ICD-10-CM

## 2015-11-14 DIAGNOSIS — R200 Anesthesia of skin: Secondary | ICD-10-CM

## 2015-11-14 DIAGNOSIS — R202 Paresthesia of skin: Secondary | ICD-10-CM | POA: Insufficient documentation

## 2015-11-14 LAB — CBC WITH DIFFERENTIAL/PLATELET
BASOS ABS: 0 10*3/uL (ref 0.0–0.1)
Basophils Relative: 1 %
EOS PCT: 3 %
Eosinophils Absolute: 0.1 10*3/uL (ref 0.0–0.7)
HCT: 42.4 % (ref 39.0–52.0)
HEMOGLOBIN: 14 g/dL (ref 13.0–17.0)
Lymphocytes Relative: 32 %
Lymphs Abs: 1.4 10*3/uL (ref 0.7–4.0)
MCH: 30 pg (ref 26.0–34.0)
MCHC: 33 g/dL (ref 30.0–36.0)
MCV: 91 fL (ref 78.0–100.0)
Monocytes Absolute: 0.6 10*3/uL (ref 0.1–1.0)
Monocytes Relative: 12 %
Neutro Abs: 2.4 10*3/uL (ref 1.7–7.7)
Neutrophils Relative %: 52 %
PLATELETS: 286 10*3/uL (ref 150–400)
RBC: 4.66 MIL/uL (ref 4.22–5.81)
RDW: 12.2 % (ref 11.5–15.5)
WBC: 4.5 10*3/uL (ref 4.0–10.5)

## 2015-11-14 LAB — BASIC METABOLIC PANEL
ANION GAP: 7 (ref 5–15)
BUN: 10 mg/dL (ref 6–20)
CHLORIDE: 105 mmol/L (ref 101–111)
CO2: 29 mmol/L (ref 22–32)
Calcium: 9.5 mg/dL (ref 8.9–10.3)
Creatinine, Ser: 1.32 mg/dL — ABNORMAL HIGH (ref 0.61–1.24)
GFR calc Af Amer: >60 mL/min (ref >60)
GFR calc non Af Amer: >60 mL/min (ref >60)
GLUCOSE: 106 mg/dL — AB (ref 65–99)
POTASSIUM: 3.8 mmol/L (ref 3.5–5.1)
SODIUM: 141 mmol/L (ref 135–145)

## 2015-11-14 LAB — POC OCCULT BLOOD, ED: FECAL OCCULT BLD: POSITIVE — AB

## 2015-11-14 MED ORDER — HYDROCORTISONE ACETATE 25 MG RE SUPP: 25.0000 mg | Freq: Two times a day (BID) | RECTAL | Status: DC

## 2015-11-14 MED ORDER — IBUPROFEN 800 MG PO TABS: 800.0000 mg | ORAL_TABLET | Freq: Three times a day (TID) | ORAL | Status: DC

## 2015-11-14 NOTE — Discharge Instructions (Signed)
You have been seen today for 2 separate complaints: Rectal bleeding and tingling in your arm. Your imaging and lab tests showed no abnormalities. Follow up with PCP as needed. Return to ED should symptoms worsen. Follow-up with orthopedics on your arm tingling as soon as possible. Follow-up with general surgery on the rectal bleeding should your symptoms not resolve. Be sure to increase the amount of fiber in your diet, drink plenty of fluids, and try to move around as much as possible during the day. Sitz baths may help ease any discomfort.   Emergency Department Resource Guide 1) Find a Doctor and Pay Out of Pocket Although you won't have to find out who is covered by your insurance plan, it is a good idea to ask around and get recommendations. You will then need to call the office and see if the doctor you have chosen will accept you as a new patient and what types of options they offer for patients who are self-pay. Some doctors offer discounts or will set up payment plans for their patients who do not have insurance, but you will need to ask so you aren't surprised when you get to your appointment.  2) Contact Your Local Health Department Not all health departments have doctors that can see patients for sick visits, but many do, so it is worth a call to see if yours does. If you don't know where your local health department is, you can check in your phone book. The CDC also has a tool to help you locate your state's health department, and many state websites also have listings of all of their local health departments.  3) Find a Walk-in Clinic If your illness is not likely to be very severe or complicated, you may want to try a walk in clinic. These are popping up all over the country in pharmacies, drugstores, and shopping centers. They're usually staffed by nurse practitioners or physician assistants that have been trained to treat common illnesses and complaints. They're usually fairly quick and  inexpensive. However, if you have serious medical issues or chronic medical problems, these are probably not your best option.  No Primary Care Doctor: - Call Health Connect at  [phone] - they can help you locate a primary care doctor that  accepts your insurance, provides certain services, etc. - Physician Referral Service- [phone]  Chronic Pain Problems: Organization         Address  Phone   Notes  [facility]  [phone] Patients need to be referred by their primary care doctor.   Medication Assistance: Organization         Address  Phone   Notes  [facility] Medication [facility] [address] [place], [place] [phone] --Must be a resident of [facility] [place] -- Must have NO insurance coverage whatsoever (no Medicaid/ Medicare, etc.) -- The pt. MUST have a primary care doctor that directs their care regularly and follows them in the community   MedAssist  [phone]   [employer]  [phone]    Agencies that provide inexpensive medical care: Organization         Address  Phone   Notes  [facility]  [phone]   [facility]    [phone]   [facility] [address] [place], [place] [address] [phone]   [facility] [address], [place] [phone]  Planned Parenthood    [phone]   [facility]    [phone]   [facility] and [address], [place] Phone:  [phone], Fax:  [phone] Hours of Operation:  9 am - 6 pm, M-F.  Also accepts Medicaid/Medicare and self-pay.  [facility] for [place], [address], [place] Phone: [phone], Fax: [phone]. Hours of Operation:  8:30 am - 5:30 pm, M-F.  Also accepts Medicaid and self-pay.  [facility] [place] [address] [place] Phone: [phone]   [place], [place], [place] [phone], Ext. [phone] Mondays & Thursdays: 7-9 AM.  First 15 patients are seen on a first come, first serve basis.    [place] Providers:  Organization         Address  Phone   Notes  [facility] [address], [place] [phone] Also accepts self-pay patients.  [facility] [address], [place]  [phone]   [place], [address], [place] [phone]   [facility] [address] [place] [phone]   Lucianne Lei [address], [place]   [phone] Only accepts [place] Access [place] patients after they have their name applied to their card.   Self-Pay (no insurance) in [facility]:  Organization         Address  Phone   Notes  Sickle Cell Patients, [facility] [place] [phone]   [facility] [place] [phone]   [facility] [place]  [place], [place], [place] [phone]   [facility]/Dr. Osei-Bonsu  [address] [place] or [address], [place] [phone] Phone number for both [place] and [place] locations is the same.  [facility] [place] [address] [place] [phone]   [facility] [address] [place] or [address] [phone]   [facility] [address], [place] [phone], phone; [phone], fax Sees patients 1st and 3rd Saturday of every month.  Must not qualify for public or private insurance (i.e. Medicaid, Medicare, [place] Health Choice, Veterans' Benefits)  Household income should be no more than 200% of the poverty level The clinic cannot treat you if you are pregnant or  think you are pregnant  Sexually transmitted diseases are not treated at the clinic.    Dental Care: Organization         Address  Phone  Notes  [facility] [phone] Accepts children up to age 21 who are enrolled in [place] or [place]; pregnant women with a Medicaid card; and children who have applied for Medicaid or [place] Health Choice, but were declined, whose parents can pay a reduced fee at time of service.  [facility] Department of [facility]  [address], [place] [phone] Accepts children up to age 38 who are enrolled in [place] or [place]; pregnant women with a Medicaid card; and children who have applied for Medicaid or [place], but were declined,  whose parents can pay a reduced fee at time of service.  [place] Adult Dental Access PROGRAM  [place] [phone] Patients are seen by appointment only. Walk-ins are not accepted. [place] will see patients 41 years of age and older. Monday - Tuesday (8am-5pm) Most Wednesdays (8:30-5pm) $30 per visit, cash only  [facility] Adult [facility] Access PROGRAM  [address], [facility] [phone] Patients are seen by appointment only. Walk-ins are not accepted. [place] will see patients 36 years of age and older. One Wednesday Evening (Monthly: Volunteer Based).  $30 per visit, cash only  [place]  [phone] for adults; Children under age 67, call Graduate Pediatric Dentistry at [phone]. Children aged 19-14, please call [phone] to request a pediatric application.  Dental services are provided in all areas of dental care including fillings, crowns and bridges, complete and partial dentures, implants, gum treatment, root canals, and extractions. Preventive care is also provided. Treatment is provided to both adults  and children. Patients are selected via a lottery and there is often a waiting list.   [facility] [address], [place]  [phone] [email or web address]   [facility] [address] [place], [place] [phone], Ext. [phone] Second and Fourth Thursday of each month, opens at 6:30 AM; Clinic ends at 9 AM.  Patients are seen on a first-come first-served basis, and a limited number are seen during each clinic.   [facility]  [address] Hillard Danker [place], [place] [phone]   Eligibility Requirements You must have lived in [place], [place], or [place] counties for at least the last three months.   You cannot be eligible for state or federal sponsored [employer], including [employer], [place], or [employer].   You generally cannot be eligible for healthcare insurance through your employer.    How to apply: Eligibility screenings are held every Tuesday and Wednesday afternoon from 1:00 pm until 4:00 pm. You do not need an appointment for the interview!  [facility] [address], [place], [place]   [place]  [place]  [place]  [phone]    Behavioral Health Resources in the Community: Intensive Outpatient Programs Organization         Address  Phone  Notes  [place]. [address], [place], [place] [phone]   [facility] Outpatient [address], [place], [place]   ADS: Alcohol & Drug Svcs [address], [place], [place]   [place] [address]  [place], [place] or [phone]   Substance Abuse Resources Organization         Address  Phone  Notes  Alcohol and Drug Services  [phone]   [place]  [phone]   [facility]     Chinita Pester  [phone]   Residential & Outpatient Substance Abuse Program  [phone]   Psychological Services Organization         Address  Phone  Notes  [facility] [place]  [place]  [phone]   [place] [address] [place] or [phone]    Mobile Crisis Teams Organization         Address  Phone  Notes  [facility],  Mobile Crisis Care Unit  [phone]   [facility]ive Psychotherapeutic Services  [address] [place], [place]   [facility] [address], [place] Advance [phone]    Self-Help/Support Groups Organization         Address  Phone             Notes  [place]. of [place] - variety of support groups  [place] Call for more information  Narcotics Anonymous (NA), [facility] [address], [employer] [place]  2 meetings at this location   Special educational needs teacher         Address  Phone  Notes  [facility] [place],    [place]  [phone]   [facility]  [address] [place] [address]0086, [place], [place]   [place], [place] [phone] Admissions: 8am-3pm M-F  Incentives Substance [facility] [address],    [place], [place] [phone]   [facility] [address] [place], [place], [place]   [facility] [address],  [place], [place]   [facility] Programs - Intensive Outpatient [address], [place], [place]   [facility] [place].,  [place], [place] [phone] or [phone]   [facility] Services (RTS) [address], [place], [place] Accepts Medicaid  [facility] [place] [address]  [place] [phone] Substance Abuse/Addiction Treatment   [facility] Organization         Address  Phone  Notes  [facility]  [phone]   Domenic Schwab, PhD [address] [place], [place]   [phone] or [phone]   [place], [place] [phone]   [facility] [address], [place], [place] [phone] Insurance/Medicaid/sponsorship through [facility] and Families [address] [place]                                    [place], [place] [phone] [place] [address], [place] [phone]    Dr. Adele Schilder  [phone]   [facility] Dept. 1) [address] [place] 2) [place] 3)  [place] [address], [place] [phone]  [phone]   [place] [phone] or [phone] (After Hours)
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
## 2015-11-14 NOTE — ED Notes (Signed)
Pt reports bright red rectal bleeding with bowel movements. Pt also reports left arm tingling/pain. States that "it feels like it is falling asleep". No neuro deficits noted.

## 2015-11-14 NOTE — ED Notes (Signed)
Patient transported to X-ray 

## 2016-10-15 ENCOUNTER — Emergency Department (HOSPITAL_COMMUNITY): Payer: 59

## 2016-10-15 ENCOUNTER — Encounter (HOSPITAL_COMMUNITY): Payer: Self-pay | Admitting: Emergency Medicine

## 2016-10-15 ENCOUNTER — Emergency Department (HOSPITAL_BASED_OUTPATIENT_CLINIC_OR_DEPARTMENT_OTHER): Admit: 2016-10-15 | Discharge: 2016-10-15 | Disposition: A | Discharge status: Home / Self Care | Payer: 59

## 2016-10-15 ENCOUNTER — Emergency Department (HOSPITAL_COMMUNITY)
Admission: EM | Admit: 2016-10-15 | Discharge: 2016-10-15 | Disposition: A | Discharge status: Home / Self Care | Payer: 59 | Attending: Emergency Medicine | Admitting: Emergency Medicine

## 2016-10-15 DIAGNOSIS — M79609 Pain in unspecified limb: Secondary | ICD-10-CM

## 2016-10-15 DIAGNOSIS — R0602 Shortness of breath: Secondary | ICD-10-CM

## 2016-10-15 DIAGNOSIS — M25562 Pain in left knee: Secondary | ICD-10-CM | POA: Insufficient documentation

## 2016-10-15 DIAGNOSIS — R0789 Other chest pain: Secondary | ICD-10-CM | POA: Insufficient documentation

## 2016-10-15 DIAGNOSIS — Z79899 Other long term (current) drug therapy: Secondary | ICD-10-CM | POA: Insufficient documentation

## 2016-10-15 DIAGNOSIS — R079 Chest pain, unspecified: Secondary | ICD-10-CM

## 2016-10-15 DIAGNOSIS — T148XXA Other injury of unspecified body region, initial encounter: Secondary | ICD-10-CM

## 2016-10-15 LAB — I-STAT TROPONIN, ED
Troponin i, poc: 0 ng/mL (ref 0.00–0.08)
Troponin i, poc: 0 ng/mL (ref 0.00–0.08)

## 2016-10-15 LAB — D-DIMER, QUANTITATIVE: D-Dimer, Quant: <0.27 ug/mL-FEU (ref 0.00–0.50)

## 2016-10-15 LAB — CBC
HEMATOCRIT: 40.8 % (ref 39.0–52.0)
HEMOGLOBIN: 13.5 g/dL (ref 13.0–17.0)
MCH: 30.2 pg (ref 26.0–34.0)
MCHC: 33.1 g/dL (ref 30.0–36.0)
MCV: 91.3 fL (ref 78.0–100.0)
Platelets: 339 10*3/uL (ref 150–400)
RBC: 4.47 MIL/uL (ref 4.22–5.81)
RDW: 12.3 % (ref 11.5–15.5)
WBC: 5.1 10*3/uL (ref 4.0–10.5)

## 2016-10-15 LAB — BASIC METABOLIC PANEL
ANION GAP: 5 (ref 5–15)
BUN: 13 mg/dL (ref 6–20)
CHLORIDE: 104 mmol/L (ref 101–111)
CO2: 28 mmol/L (ref 22–32)
Calcium: 9.6 mg/dL (ref 8.9–10.3)
Creatinine, Ser: 1.39 mg/dL — ABNORMAL HIGH (ref 0.61–1.24)
GFR calc Af Amer: >60 mL/min (ref >60)
GFR calc non Af Amer: >60 mL/min (ref >60)
GLUCOSE: 97 mg/dL (ref 65–99)
POTASSIUM: 4.1 mmol/L (ref 3.5–5.1)
Sodium: 137 mmol/L (ref 135–145)

## 2016-10-15 MED ORDER — CYCLOBENZAPRINE HCL 10 MG PO TABS: 10.0000 mg | ORAL_TABLET | Freq: Two times a day (BID) | ORAL | 0 refills | Status: DC | PRN

## 2016-10-15 MED ORDER — ASPIRIN 81 MG PO CHEW
324.0000 mg | CHEWABLE_TABLET | Freq: Once | ORAL | Status: AC
  Administered 2016-10-15: 324 mg via ORAL
  Filled 2016-10-15: qty 4

## 2016-10-15 MED ORDER — HYDROCODONE-ACETAMINOPHEN 5-325 MG PO TABS: 1.0000 | ORAL_TABLET | Freq: Four times a day (QID) | ORAL | 0 refills | Status: DC | PRN

## 2016-10-15 NOTE — ED Provider Notes (Signed)
[facility]-EMERGENCY DEPT Provider Note   CSN: [id number] Arrival date & time: 10/15/16  1330     History   Chief Complaint Chief Complaint  Patient presents with  . Knee Pain  . Chest Pain    HPI Lance Gomez is a 40 y.o. male with no significant past medical history who presents with chest pain, and left knee pain. Patient reports that for the last week, he has had some chest discomfort. He says that it feels like it is a burning pain. He denies any association with eating or sensation of food impaction. He reports some nausea but no vomiting. He reports some lightheadedness that has resolved. He reports no palpitations or shortness of breath. He does report that his pain is a 6 out of 10 in severity when he is feeling. He says it is worse with deep breaths and coughing. He says it is occasionally worse with exertion as he moves around. He says that he is taking ibuprofen without significant relief. He denies any recent chest trauma or any prior episodes.  He says that he has had some left knee pain which she describes as sharp, 8 out of 10, and across his knee. He denies any trauma. He says it feels stiff. He denies any swelling. He denies any lower leg numbness, daily, or weakness. He denies any recent new trauma. He denies any history of DVTs or PE. He does not smoke. He denies a family history of clotting disorders or cardiac disease.   The history is provided by the patient and medical records. No language interpreter was used.  Chest Pain   This is a new problem. The current episode started more than 2 days ago. The problem occurs daily. The problem has not changed since onset.The pain is associated with coughing and movement. The pain is present in the substernal region. The pain is at a severity of 6/10. The pain is moderate. The quality of the pain is described as pleuritic and sharp. The pain does not radiate. The symptoms are aggravated by certain positions and deep breathing.  Associated symptoms include cough. Pertinent negatives include no abdominal pain, no back pain, no diaphoresis, no dizziness, no exertional chest pressure, no fever, no headaches, no lower extremity edema, no nausea, no numbness, no palpitations, no shortness of breath, no vomiting and no weakness. He has tried nothing for the symptoms.  Pertinent negatives for past medical history include no CAD.    Past Medical History:  Diagnosis Date  . Chest pain   . Headache   . Hematuria   . History of penile discharge     There are no active problems to display for this patient.   History reviewed. No pertinent surgical history.     Home Medications    Prior to Admission medications   Medication Sig Start Date End Date Taking? Authorizing Provider  cyclobenzaprine (FLEXERIL) 10 MG tablet Take 10 mg by mouth 2 (two) times daily as needed for muscle spasms.    Historical Provider, MD  hydrocortisone (ANUCORT-HC) 25 MG suppository Place 1 suppository (25 mg total) rectally 2 (two) times daily. 11/14/15   Shawn C Joy, PA-C  ibuprofen (ADVIL,MOTRIN) 800 MG tablet Take 1 tablet (800 mg total) by mouth 3 (three) times daily. 11/14/15   Shawn C Joy, PA-C  PRESCRIPTION MEDICATION Take 1 tablet by mouth daily.    Historical Provider, MD    Family History No family history on file.  Social History Social History  Substance Use  Topics  . Smoking status: Never Smoker  . Smokeless tobacco: Never Used  . Alcohol use No     Allergies   Patient has no known allergies.   Review of Systems Review of Systems  Constitutional: Negative for activity change, chills, diaphoresis, fatigue and fever.  HENT: Negative for congestion and rhinorrhea.   Eyes: Negative for visual disturbance.  Respiratory: Positive for cough. Negative for chest tightness, shortness of breath, wheezing and stridor.   Cardiovascular: Positive for chest pain. Negative for palpitations and leg swelling.  Gastrointestinal:  Negative for abdominal distention, abdominal pain, blood in stool, constipation, diarrhea, nausea and vomiting.  Genitourinary: Negative for difficulty urinating, dysuria and flank pain.  Musculoskeletal: Negative for back pain, gait problem, neck pain and neck stiffness.  Skin: Negative for rash and wound.  Neurological: Negative for dizziness, weakness, light-headedness, numbness and headaches.  Psychiatric/Behavioral: Negative for agitation.  All other systems reviewed and are negative.    Physical Exam Updated Vital Signs BP 147/95 (BP Location: Left Arm)   Pulse 62   Temp 98.6 F (37 C) (Oral)   Resp 19   SpO2 100%   Physical Exam  Constitutional: He appears well-developed and well-nourished.  HENT:  Head: Normocephalic and atraumatic.  Eyes: Conjunctivae are normal.  Neck: Neck supple.  Cardiovascular: Normal rate, regular rhythm, normal heart sounds and intact distal pulses.   No murmur heard. Pulmonary/Chest: Effort normal and breath sounds normal. No respiratory distress. He has no wheezes. He has no rales. He exhibits tenderness.    Abdominal: Soft. There is no tenderness.  Musculoskeletal: He exhibits tenderness. He exhibits no edema.       Left knee: He exhibits normal range of motion, no swelling, no effusion, no ecchymosis, no deformity, no laceration, no erythema, no LCL laxity, normal patellar mobility and no MCL laxity. Tenderness found.       Legs: Neurological: He is alert.  Skin: Skin is warm and dry.  Psychiatric: He has a normal mood and affect.  Nursing note and vitals reviewed.    ED Treatments / Results  Labs (all labs ordered are listed, but only abnormal results are displayed) Labs Reviewed  BASIC METABOLIC PANEL - Abnormal; Notable for the following:       Result Value   Creatinine, Ser 1.39 (*)    All other components within normal limits  CBC  D-DIMER, QUANTITATIVE (NOT AT [facility])  I-STAT TROPOININ, ED  I-STAT TROPOININ, ED    EKG   EKG Interpretation  Date/Time:  Wednesday October 15 2016 13:40:21 EST Ventricular Rate:  76 PR Interval:    QRS Duration: 98 QT Interval:  365 QTC Calculation: 411 R Axis:   -52 Text Interpretation:  Sinus rhythm Probable left atrial enlargement Left anterior fascicular block Abnormal R-wave progression, early transition Minimal ST elevation, lateral leads When compared to prior, No significant changes were seen. No STEMI Confirmed by [facility] MD, Nakeia Calvi [phone]) on 10/15/2016 4:24:27 PM       Radiology Dg Chest 2 View  Result Date: 10/15/2016 CLINICAL DATA:  Chest pain, shortness of breath. EXAM: CHEST  2 VIEW COMPARISON:  Radiographs of October 06, 2014. FINDINGS: The heart size and mediastinal contours are within normal limits. Stable scarring is seen in left lung base. No acute pulmonary disease is noted. No pneumothorax or pleural effusion is noted. The visualized skeletal structures are unremarkable. IMPRESSION: No active cardiopulmonary disease. Electronically Signed   By: Lupita Raider, M.D.   On: 10/15/2016 15:09  Dg Knee Complete 4 Views Left  Result Date: 10/15/2016 CLINICAL DATA:  Left knee pain and stiffness for 2 weeks, no acute injury EXAM: LEFT KNEE - COMPLETE 4+ VIEW COMPARISON:  None. FINDINGS: Joint spaces appear normal with only minimal degenerative change involving the medial compartment with there is some spurring present. No fracture seen and no joint effusion is noted. A normal variation of a bifid patella is present. IMPRESSION: 1. Mild degenerative change involves the medial compartment. 2. No acute abnormality. 3. Normal variation of bifid patella. Electronically Signed   By: Dwyane DeePaul  Barry M.D.   On: 10/15/2016 16:49    Procedures Procedures (including critical care time)  Medications Ordered in ED Medications  aspirin chewable tablet 324 mg (324 mg Oral Given 10/15/16 1653)     Initial Impression / Assessment and Plan / ED Course  I have reviewed  the triage vital signs and the nursing notes.  Pertinent labs & imaging results that were available during my care of the patient were reviewed by me and considered in my medical decision making (see chart for details).  Clinical Course     Melanie CrazierDonte Albro is a 40 y.o. male with no significant past medical history who presents with chest pain, and left knee pain.   History and exam are seen above.  Exam is significant for sternal lower chest tenderness. This reproduces his discomfort. Lungs are clear. No murmur. Abdomen nontender. Left knee is tender to palpation. Patient has full range of motion of left knee. No crepitance, erythema, or edema. Normal neurovascular exam of left leg. Neuro exam intact.  Heart score calculated as a 2.  EKG appeared unchanged from prior. Patient given aspirin and will have workup to look for etiology of chest pain. Suspect musculoskeletal pain given reproducibility however, due to knee stiffness and pain, d-dimer ordered and lower extremity DVT study was ordered.  DVT study was negative. X-ray of knee was negative aside from degenerative changes. No evidence of effusion. Do not feel patient has septic arthritis due to exam and appearance. Chest x-ray showed no acute laterality and laboratory testing reassuring. Troponin negative 2.  Given reassuring workup, patient felt pain is likely musculoskeletal. Patient will be discharged with plans to follow-up with a PCP. Patient given strict return precautions. Patient given pain medicine and muscle relaxants due to likely musculoskeletal etiology.  Patient understood return precautions and was discharged in good condition.    Final Clinical Impressions(s) / ED Diagnoses   Final diagnoses:  Acute pain of left knee  Chest pain, unspecified type    New Prescriptions Discharge Medication List as of 10/15/2016  6:52 PM    START taking these medications   Details  cyclobenzaprine (FLEXERIL) 10 MG tablet Take 1  tablet (10 mg total) by mouth 2 (two) times daily as needed for muscle spasms., Starting Wed 10/15/2016, Print    HYDROcodone-acetaminophen (NORCO/VICODIN) 5-325 MG tablet Take 1 tablet by mouth every 6 (six) hours as needed., Starting Wed 10/15/2016, Print        Clinical Impression: 1. Acute pain of left knee   2. Fracture   3. SOB (shortness of breath)   4. Chest pain, unspecified type     Disposition: Discharge  Condition: Good  I have discussed the results, Dx and Tx plan with the pt(& family if present). He/she/they expressed understanding and agree(s) with the plan. Discharge instructions discussed at great length. Strict return precautions discussed and pt &/or family have verbalized understanding of the instructions. No  further questions at time of discharge.    Discharge Medication List as of 10/15/2016  6:52 PM    START taking these medications   Details  cyclobenzaprine (FLEXERIL) 10 MG tablet Take 1 tablet (10 mg total) by mouth 2 (two) times daily as needed for muscle spasms., Starting Wed 10/15/2016, Print    HYDROcodone-acetaminophen (NORCO/VICODIN) 5-325 MG tablet Take 1 tablet by mouth every 6 (six) hours as needed., Starting Wed 10/15/2016, Print        [facility] [address] [place] [id number] [phone]       Heide Scaleshristopher J Alianny Toelle, MD 10/17/16 1157

## 2016-10-15 NOTE — Discharge Instructions (Signed)
Please follow-up with a regular doctor for further management of your pain. If symptoms worsen, please return to the nearest emergency department.

## 2016-10-15 NOTE — ED Notes (Signed)
Patient understood discharge instructions and follow up care. He is A & O x4.

## 2016-10-15 NOTE — ED Notes (Signed)
Patient's BP was elevated due to movement.  Repeated BP twice.

## 2016-10-15 NOTE — ED Triage Notes (Signed)
Pt reports chest discomfort x 1 week, sts he feels like food is stuck in chest area, burning sensation. No Hx acid reflux. No radiation . Nausea yesterday but not today per pt.  Also reports left knee stiffness, no injury nor fall .

## 2016-10-15 NOTE — Progress Notes (Signed)
*  Preliminary Results* Left lower extremity venous duplex completed. Left lower extremity is negative for deep vein thrombosis. There is no evidence of left Baker's cyst.  10/15/2016 6:20 PM  Gertie FeyMichelle Chieko Neises, BS, RVT, RDCS, RDMS

## 2017-02-24 ENCOUNTER — Encounter (HOSPITAL_COMMUNITY): Payer: Self-pay | Admitting: Emergency Medicine

## 2017-02-24 ENCOUNTER — Emergency Department (HOSPITAL_COMMUNITY)
Admission: EM | Admit: 2017-02-24 | Discharge: 2017-02-24 | Disposition: A | Discharge status: Home / Self Care | Payer: 59 | Attending: Emergency Medicine | Admitting: Emergency Medicine

## 2017-02-24 DIAGNOSIS — Z79899 Other long term (current) drug therapy: Secondary | ICD-10-CM | POA: Insufficient documentation

## 2017-02-24 DIAGNOSIS — M545 Low back pain: Secondary | ICD-10-CM

## 2017-02-24 MED ORDER — LIDOCAINE 5 % EX PTCH: 1.0000 | MEDICATED_PATCH | CUTANEOUS | 0 refills | Status: DC

## 2017-02-24 MED ORDER — METHOCARBAMOL 500 MG PO TABS: 500.0000 mg | ORAL_TABLET | Freq: Four times a day (QID) | ORAL | 0 refills | Status: DC | PRN

## 2017-02-24 MED ORDER — KETOROLAC TROMETHAMINE 60 MG/2ML IM SOLN
60.0000 mg | Freq: Once | INTRAMUSCULAR | Status: AC
  Administered 2017-02-24: 60 mg via INTRAMUSCULAR
  Filled 2017-02-24: qty 2

## 2017-02-24 MED ORDER — MELOXICAM 7.5 MG PO TABS: 7.5000 mg | ORAL_TABLET | Freq: Every day | ORAL | 0 refills | Status: DC

## 2017-02-24 NOTE — ED Triage Notes (Signed)
Pt reports lower back pain radiating to hip for the past week. No known injury.

## 2017-02-24 NOTE — Discharge Instructions (Signed)
Read the information below.  Use the prescribed medication as directed.  Please discuss all new medications with your pharmacist.  You may return to the Emergency Department at any time for worsening condition or any new symptoms that concern you.    If you develop fevers, loss of control of bowel or bladder, weakness or numbness in your legs, or are unable to walk, return to the ER for a recheck.  °

## 2017-02-24 NOTE — ED Provider Notes (Signed)
[facility]-EMERGENCY DEPT Provider Note   CSN: [id number] Arrival date & time: 02/24/17  1606  By signing my name below, I, Rosario Adie, attest that this documentation has been prepared under the direction and in the presence of [facility], PA-C.  Electronically Signed: Rosario Adie, ED Scribe. 02/24/17. 4:54 PM.  History   Chief Complaint Chief Complaint  Patient presents with  . Back Pain   The history is provided by the patient. No language interpreter was used.    HPI Comments: Lance Gomez is a 41 y.o. male who presents to the Emergency Department complaining of left-sided lower back pain which began one week ago. He describes his pain as pressure-like at rest, but he notes that when switching from sitting to standing his pain becomes more sharp. No recent falls, heavy lifting, or injury to the back. His pain is worse with standing, twisting, and ambulation. No treatments for his pain were tried prior to coming into the ED. No h/o cancer or IVDU. He denies rash, numbness, weakness, paraesthesias, bowel/bladder incontinence, abdominal pain, dysuria, fever, chills, diaphoresis, or any other associated symptoms.   Past Medical History:  Diagnosis Date  . Chest pain   . Headache   . Hematuria   . History of penile discharge    There are no active problems to display for this patient.  History reviewed. No pertinent surgical history.  Home Medications    Prior to Admission medications   Medication Sig Start Date End Date Taking? Authorizing Provider  cyclobenzaprine (FLEXERIL) 10 MG tablet Take 1 tablet (10 mg total) by mouth 2 (two) times daily as needed for muscle spasms. 10/15/16   Canary Brim Tegeler, MD  HYDROcodone-acetaminophen (NORCO/VICODIN) 5-325 MG tablet Take 1 tablet by mouth every 6 (six) hours as needed. 10/15/16   Canary Brim Tegeler, MD  hydrocortisone (ANUCORT-HC) 25 MG suppository Place 1 suppository (25 mg total) rectally 2 (two) times  daily. Patient not taking: Reported on 10/15/2016 11/14/15   Shawn C Joy, PA-C  ibuprofen (ADVIL,MOTRIN) 800 MG tablet Take 1 tablet (800 mg total) by mouth 3 (three) times daily. 11/14/15   Shawn C Joy, PA-C  lidocaine (LIDODERM) 5 % Place 1 patch onto the skin daily. Remove & Discard patch within 12 hours or as directed by MD 02/24/17   Trixie Dredge, PA-C  meloxicam (MOBIC) 7.5 MG tablet Take 1 tablet (7.5 mg total) by mouth daily. 02/24/17   Trixie Dredge, PA-C  methocarbamol (ROBAXIN) 500 MG tablet Take 1-2 tablets (500-1,000 mg total) by mouth every 6 (six) hours as needed for muscle spasms (and pain). 02/24/17   Trixie Dredge, PA-C   Family History History reviewed. No pertinent family history.  Social History Social History  Substance Use Topics  . Smoking status: Never Smoker  . Smokeless tobacco: Never Used  . Alcohol use No   Allergies   Patient has no known allergies.  Review of Systems Review of Systems  Constitutional: Negative for chills, diaphoresis and fever.  Gastrointestinal: Negative for abdominal pain.  Genitourinary: Negative for dysuria.  Musculoskeletal: Positive for back pain and myalgias.  Skin: Negative for rash.  Neurological: Negative for weakness and numbness.       Negative for paraesthesias. Negative for bowel/bladder incontinence.    Physical Exam Updated Vital Signs BP 137/90 (BP Location: Right Arm)   Pulse 86   Temp 98.4 F (36.9 C) (Oral)   Resp 16   Ht  (1.803 m)   Wt 92.5 kg  SpO2 99%   BMI 28.45 kg/m   Physical Exam  Constitutional: He appears well-developed and well-nourished. No distress.  HENT:  Head: Normocephalic and atraumatic.  Neck: Neck supple.  Pulmonary/Chest: Effort normal.  Abdominal: Soft. He exhibits no distension. There is no tenderness. There is no rebound and no guarding.  Musculoskeletal:  Left lumbar spinal tenderness with spasm to the musculature of the left lower back, no crepitus, or stepoffs. Lower  extremities:  Strength 5/5, sensation intact, distal pulses intact.    Passive ROM of the left hip w/o pain, gait was slow but overall unremarkable.   Neurological: He is alert.  Skin: He is not diaphoretic.  Nursing note and vitals reviewed.  ED Treatments / Results  DIAGNOSTIC STUDIES: Oxygen Saturation is 99% on RA, normal by my interpretation.   COORDINATION OF CARE: 4:55 PM-Discussed next steps with pt. Pt verbalized understanding and is agreeable with the plan.   Labs (all labs ordered are listed, but only abnormal results are displayed) Labs Reviewed - No data to display  EKG  EKG Interpretation None      Radiology No results found.  Procedures Procedures   Medications Ordered in ED Medications  ketorolac (TORADOL) injection 60 mg (60 mg Intramuscular Given 02/24/17 1704)    Initial Impression / Assessment and Plan / ED Course  I have reviewed the triage vital signs and the nursing notes.  Pertinent labs & imaging results that were available during my care of the patient were reviewed by me and considered in my medical decision making (see chart for details).     Afebrile, nontoxic patient with muscular low back pain. No red flags.  D/C home with symptomatic medications.  Discussed result, findings, treatment, and follow up  with patient.  Pt given return precautions.  Pt verbalizes understanding and agrees with plan.      Final Clinical Impressions(s) / ED Diagnoses   Final diagnoses:  Acute left-sided low back pain, with sciatica presence unspecified   New Prescriptions Discharge Medication List as of 02/24/2017  4:59 PM    START taking these medications   Details  lidocaine (LIDODERM) 5 % Place 1 patch onto the skin daily. Remove & Discard patch within 12 hours or as directed by MD, Starting Tue 02/24/2017, Print    meloxicam (MOBIC) 7.5 MG tablet Take 1 tablet (7.5 mg total) by mouth daily., Starting Tue 02/24/2017, Print    methocarbamol (ROBAXIN) 500 MG  tablet Take 1-2 tablets (500-1,000 mg total) by mouth every 6 (six) hours as needed for muscle spasms (and pain)., Starting Tue 02/24/2017, Print       I personally performed the services described in this documentation, which was scribed in my presence. The recorded information has been reviewed and is accurate.     Trixie Dredge, PA-C 02/24/17 4098    Doug Sou, MD 02/25/17 [phone]

## 2017-02-24 NOTE — ED Notes (Signed)
Pt ambulatory and independent at discharge.  Verbalized understanding of discharge instructions 

## 2017-02-28 ENCOUNTER — Emergency Department (HOSPITAL_COMMUNITY)
Admission: EM | Admit: 2017-02-28 | Discharge: 2017-02-28 | Disposition: A | Discharge status: Home / Self Care | Payer: 59 | Attending: Emergency Medicine | Admitting: Emergency Medicine

## 2017-02-28 ENCOUNTER — Encounter (HOSPITAL_COMMUNITY): Payer: Self-pay

## 2017-02-28 DIAGNOSIS — M5442 Lumbago with sciatica, left side: Secondary | ICD-10-CM | POA: Insufficient documentation

## 2017-02-28 MED ORDER — METHOCARBAMOL 500 MG PO TABS
1000.0000 mg | ORAL_TABLET | Freq: Once | ORAL | Status: AC
  Administered 2017-02-28: 1000 mg via ORAL
  Filled 2017-02-28: qty 2

## 2017-02-28 MED ORDER — METHOCARBAMOL 500 MG PO TABS: 500.0000 mg | ORAL_TABLET | Freq: Two times a day (BID) | ORAL | 0 refills | Status: DC | PRN

## 2017-02-28 MED ORDER — NAPROXEN 250 MG PO TABS: 250.0000 mg | ORAL_TABLET | Freq: Two times a day (BID) | ORAL | 0 refills | Status: DC

## 2017-02-28 MED ORDER — ACETAMINOPHEN 325 MG PO TABS
650.0000 mg | ORAL_TABLET | Freq: Once | ORAL | Status: AC
  Administered 2017-02-28: 650 mg via ORAL
  Filled 2017-02-28: qty 2

## 2017-02-28 NOTE — ED Provider Notes (Signed)
[facility]EMER[facility]ENCY DEPT Provider Note   CSN: [id number] Arrival date & time: 02/28/17  2001     History   Chief Complaint Chief Complaint  Patient presents with  . Back Pain    HPI Lance Gomez is a 41 y.o. male.  Lance Gomez is a 41 y.o. Male who presents to the emergency department complaining of 2 weeks of bilateral low back pain that is worse on his left side. He reports his pain is been ongoing for about 2 weeks now. He reports it began after he believed he slept wrong. He complains of bilateral low back pain that is worse with movement. He reports his pain is also worse with ambulation. He denies any known injury or trauma to his back. He was seen in the emergency department several days ago and was given prescriptions for [place], Robaxin and lidocaine gel. He tells me he did not fill any his prescriptions. I asked him why and if this is because they're too expensive. He tells me he did not even attempt to fill them. He has been taking nothing for treatment of his symptoms at home. He has attempted no treatments for his back pain. He denies fevers, history of cancer, history of IV drug use, also bladder control, loss of bowel control, urinary symptoms, back injury, numbness, tingling, weakness or rashes.   The history is provided by the patient and medical records. No language interpreter was used.  Back Pain   Pertinent negatives include no chest pain, no fever, no numbness, no headaches, no abdominal pain, no dysuria and no weakness.    Past Medical History:  Diagnosis Date  . Chest pain   . Headache   . Hematuria   . History of penile discharge     There are no active problems to display for this patient.   History reviewed. No pertinent surgical history.     Home Medications    Prior to Admission medications   Medication Sig Start Date End Date Taking? Authorizing Provider  HYDROcodone-acetaminophen (NORCO/VICODIN) 5-325 MG tablet Take 1 tablet by mouth every 6  (six) hours as needed. 10/15/16   Canary Brim Tegeler, MD  hydrocortisone (ANUCORT-HC) 25 MG suppository Place 1 suppository (25 mg total) rectally 2 (two) times daily. Patient not taking: Reported on 10/15/2016 11/14/15   Shawn C Joy, PA-C  lidocaine (LIDODERM) 5 % Place 1 patch onto the skin daily. Remove & Discard patch within 12 hours or as directed by MD 02/24/17   Trixie Dredge, PA-C  methocarbamol (ROBAXIN) 500 MG tablet Take 1 tablet (500 mg total) by mouth 2 (two) times daily as needed for muscle spasms. 02/28/17   Everlene Farrier, PA-C  naproxen (NAPROSYN) 250 MG tablet Take 1 tablet (250 mg total) by mouth 2 (two) times daily with a meal. 02/28/17   Everlene Farrier, PA-C    Family History No family history on file.  Social History Social History  Substance Use Topics  . Smoking status: Never Smoker  . Smokeless tobacco: Never Used  . Alcohol use No     Allergies   Patient has no known allergies.   Review of Systems Review of Systems  Constitutional: Negative for chills, fever and unexpected weight change.  HENT: Negative for sore throat.   Eyes: Negative for visual disturbance.  Respiratory: Negative for cough and shortness of breath.   Cardiovascular: Negative for chest pain and leg swelling.  Gastrointestinal: Negative for abdominal pain, nausea and vomiting.  Genitourinary: Negative for decreased urine volume,  difficulty urinating, dysuria, frequency, hematuria and testicular pain.  Musculoskeletal: Positive for back pain. Negative for neck pain.  Skin: Negative for rash.  Neurological: Negative for weakness, numbness and headaches.     Physical Exam Updated Vital Signs BP (!) 131/95   Pulse 81   Temp 97.8 F (36.6 C) (Oral)   Resp 18   Ht  (1.803 m)   Wt 92.5 kg   SpO2 99%   BMI 28.45 kg/m   Physical Exam  Constitutional: He appears well-developed and well-nourished. No distress.  Nontoxic appearing.  HENT:  Head: Normocephalic and atraumatic.    Eyes: Conjunctivae are normal. Pupils are equal, round, and reactive to light. Right eye exhibits no discharge. Left eye exhibits no discharge.  Neck: Neck supple.  Cardiovascular: Normal rate, regular rhythm, normal heart sounds and intact distal pulses.   Pulmonary/Chest: Effort normal and breath sounds normal. No respiratory distress.  Abdominal: Soft. There is no tenderness. There is no guarding.  Musculoskeletal: Normal range of motion. He exhibits tenderness. He exhibits no edema or deformity.  Tenderness across his bilateral low back musculature. No midline neck or back tenderness. No overlying skin changes to his back. No back erythema, deformity, ecchymosis or warmth. Good strength in his bilateral lower extremities. No lower extremity edema or tenderness.  Lymphadenopathy:    He has no cervical adenopathy.  Neurological: He is alert. He displays normal reflexes. No sensory deficit. Coordination normal.  Bilateral patellar DTRs are intact. Sensation is intact his bilateral lower extremities. Antalgic gait.  Skin: Skin is warm and dry. Capillary refill takes less than 2 seconds. No rash noted. He is not diaphoretic. No erythema. No pallor.  Psychiatric: He has a normal mood and affect. His behavior is normal.  Nursing note and vitals reviewed.    ED Treatments / Results  Labs (all labs ordered are listed, but only abnormal results are displayed) Labs Reviewed - No data to display  EKG  EKG Interpretation None       Radiology No results found.  Procedures Procedures (including critical care time)  Medications Ordered in ED Medications  acetaminophen (TYLENOL) tablet 650 mg (not administered)  methocarbamol (ROBAXIN) tablet 1,000 mg (not administered)     Initial Impression / Assessment and Plan / ED Course  I have reviewed the triage vital signs and the nursing notes.  Pertinent labs & imaging results that were available during my care of the patient were  reviewed by me and considered in my medical decision making (see chart for details).    This  is a 41 y.o. Male who presents to the emergency department complaining of 2 weeks of bilateral low back pain that is worse on his left side. He reports his pain is been ongoing for about 2 weeks now. He reports it began after he believed he slept wrong. He complains of bilateral low back pain that is worse with movement. He reports his pain is also worse with ambulation. He denies any known injury or trauma to his back. He was seen in the emergency department several days ago and was given prescriptions for Mobic, Robaxin and lidocaine gel. He tells me he did not fill any his prescriptions. I asked him why and if this is because they're too expensive. He tells me he did not even attempt to fill them. He has been taking nothing for treatment of his symptoms at home. On exam the patient is afebrile nontoxic appearing.  No neurological deficits and normal neuro  exam.  Patient can walk but states is painful.  No loss of bowel or bladder control.  No concern for cauda equina.  No fever, night sweats, weight loss, h/o cancer, IVDU.  I encouraged the patient to use back exercises that I discussed with him. I also encouraged him to fill his prescriptions for his pain. I advised the patient to follow-up with their primary care provider this week. I advised the patient to return to the emergency department with new or worsening symptoms or new concerns. The patient verbalized understanding and agreement with plan.     Final Clinical Impressions(s) / ED Diagnoses   Final diagnoses:  Acute bilateral low back pain with left-sided sciatica    New Prescriptions New Prescriptions   METHOCARBAMOL (ROBAXIN) 500 MG TABLET    Take 1 tablet (500 mg total) by mouth 2 (two) times daily as needed for muscle spasms.   NAPROXEN (NAPROSYN) 250 MG TABLET    Take 1 tablet (250 mg total) by mouth 2 (two) times daily with a meal.      Everlene Farrier, PA-C 02/28/17 2056    Loren Racer, MD 02/28/17 2241

## 2017-02-28 NOTE — ED Triage Notes (Signed)
Pt presents to the ed for pain in his back that started 5 days ago, he has severe pain in his lower back that he was diagnosed with a pulled muscle at [place], however the pain is getting worse so he came back today.  Denies any trouble urinating or having bowel movements, denies any injury or numbness. Patient is ambulatory and tearful during triage.

## 2017-02-28 NOTE — ED Notes (Signed)
Pt understood dc material. NAD noted. Scritps given at [facility]

## 2017-05-20 ENCOUNTER — Emergency Department (HOSPITAL_COMMUNITY)
Admission: EM | Admit: 2017-05-20 | Discharge: 2017-05-20 | Disposition: A | Discharge status: Home / Self Care | Payer: Self-pay | Attending: Emergency Medicine | Admitting: Emergency Medicine

## 2017-05-20 ENCOUNTER — Emergency Department (HOSPITAL_COMMUNITY): Payer: Self-pay

## 2017-05-20 ENCOUNTER — Encounter (HOSPITAL_COMMUNITY): Payer: Self-pay | Admitting: *Deleted

## 2017-05-20 DIAGNOSIS — R519 Headache, unspecified: Secondary | ICD-10-CM

## 2017-05-20 DIAGNOSIS — R51 Headache: Secondary | ICD-10-CM | POA: Insufficient documentation

## 2017-05-20 LAB — BASIC METABOLIC PANEL
ANION GAP: 8 (ref 5–15)
BUN: 9 mg/dL (ref 6–20)
CO2: 26 mmol/L (ref 22–32)
Calcium: 9.8 mg/dL (ref 8.9–10.3)
Chloride: 103 mmol/L (ref 101–111)
Creatinine, Ser: 1.24 mg/dL (ref 0.61–1.24)
GLUCOSE: 100 mg/dL — AB (ref 65–99)
POTASSIUM: 3.9 mmol/L (ref 3.5–5.1)
Sodium: 137 mmol/L (ref 135–145)

## 2017-05-20 LAB — CBC WITH DIFFERENTIAL/PLATELET
BASOS ABS: 0.1 10*3/uL (ref 0.0–0.1)
Basophils Relative: 1 %
Eosinophils Absolute: 0.1 10*3/uL (ref 0.0–0.7)
Eosinophils Relative: 2 %
HEMATOCRIT: 44.1 % (ref 39.0–52.0)
HEMOGLOBIN: 14.3 g/dL (ref 13.0–17.0)
LYMPHS PCT: 38 %
Lymphs Abs: 2.1 10*3/uL (ref 0.7–4.0)
MCH: 29.7 pg (ref 26.0–34.0)
MCHC: 32.4 g/dL (ref 30.0–36.0)
MCV: 91.5 fL (ref 78.0–100.0)
MONO ABS: 0.4 10*3/uL (ref 0.1–1.0)
Monocytes Relative: 8 %
NEUTROS ABS: 2.8 10*3/uL (ref 1.7–7.7)
NEUTROS PCT: 51 %
Platelets: 334 10*3/uL (ref 150–400)
RBC: 4.82 MIL/uL (ref 4.22–5.81)
RDW: 12.2 % (ref 11.5–15.5)
WBC: 5.4 10*3/uL (ref 4.0–10.5)

## 2017-05-20 MED ORDER — PROCHLORPERAZINE EDISYLATE 5 MG/ML IJ SOLN
10.0000 mg | Freq: Once | INTRAMUSCULAR | Status: AC
  Administered 2017-05-20: 10 mg via INTRAVENOUS
  Filled 2017-05-20: qty 2

## 2017-05-20 MED ORDER — KETOROLAC TROMETHAMINE 30 MG/ML IJ SOLN
30.0000 mg | Freq: Once | INTRAMUSCULAR | Status: AC
  Administered 2017-05-20: 30 mg via INTRAVENOUS
  Filled 2017-05-20: qty 1

## 2017-05-20 MED ORDER — BUTALBITAL-APAP-CAFFEINE 50-325-40 MG PO TABS: 1.0000 | ORAL_TABLET | Freq: Four times a day (QID) | ORAL | 0 refills | Status: DC | PRN

## 2017-05-20 MED ORDER — SODIUM CHLORIDE 0.9 % IV BOLUS (SEPSIS)
1000.0000 mL | Freq: Once | INTRAVENOUS | Status: AC
  Administered 2017-05-20: 1000 mL via INTRAVENOUS

## 2017-05-20 MED ORDER — SODIUM CHLORIDE 0.9 % IV BOLUS (SEPSIS): 1000.0000 mL | Freq: Once | INTRAVENOUS | Status: DC

## 2017-05-20 MED ORDER — BUTALBITAL-APAP-CAFFEINE 50-325-40 MG PO TABS
2.0000 | ORAL_TABLET | ORAL | Status: AC
  Administered 2017-05-20: 2 via ORAL
  Filled 2017-05-20: qty 2

## 2017-05-20 MED ORDER — DIPHENHYDRAMINE HCL 50 MG/ML IJ SOLN
25.0000 mg | Freq: Once | INTRAMUSCULAR | Status: AC
  Administered 2017-05-20: 25 mg via INTRAVENOUS
  Filled 2017-05-20: qty 1

## 2017-05-20 NOTE — ED Triage Notes (Signed)
Pt reports having headaches x 1 week, no relief with otc meds. Reports headaches are causing him to feel dizzy. No acute distress is noted at triage.

## 2017-05-20 NOTE — Discharge Instructions (Signed)
Read the information below.  Use the prescribed medication as directed.  Please discuss all new medications with your pharmacist.  You may return to the Emergency Department at any time for worsening condition or any new symptoms that concern you.     I have referred you to a Neurologist, who is a headache specialist.  Please see them for further evaluation and treatment of your chronic headaches.  You are having a headache. No specific cause was found today for your headache. It may have been a migraine or other cause of headache. Stress, anxiety, fatigue, and depression are common triggers for headaches. Your headache today does not appear to be life-threatening or require hospitalization, but often the exact cause of headaches is not determined in the emergency department. Therefore, follow-up with your doctor is very important to find out what may have caused your headache, and whether or not you need any further diagnostic testing or treatment. Sometimes headaches can appear benign (not harmful), but then more serious symptoms can develop which should prompt an immediate re-evaluation by your doctor or the emergency department. SEEK MEDICAL ATTENTION IF: You develop possible problems with medications prescribed.  The medications don't resolve your headache, if it recurs , or if you have multiple episodes of vomiting or can't take fluids. You have a change from the usual headache. RETURN IMMEDIATELY IF you develop a sudden, severe headache or confusion, become poorly responsive or faint, develop a fever above 100.25F or problem breathing, have a change in speech, vision, swallowing, or understanding, or develop new weakness, numbness, tingling, incoordination, or have a seizure.

## 2017-05-20 NOTE — ED Notes (Signed)
Got patient undress on the monitor patient is waiting on provider 

## 2017-05-20 NOTE — ED Provider Notes (Signed)
[facility] Provider Note   CSN: [id number] Arrival date & time: 05/20/17  1048     History   Chief Complaint Chief Complaint  Patient presents with  . Headache  . Dizziness    HPI Lance Gomez is a 41 y.o. male.  HPI   Pt presents with daily headaches for the past month.  States he previously got headaches every other day or a few times a week but for the past month has had daily headache.  They come on gradually, are sharp and pounding, last few a few hours and subside.  Takes ibuprofen for relief.  Associated sensitivity to light and movement.  He has also been occasionally lightheaded with standing.  Denies fevers, vomiting, focal neurologic deficits, syncope, CP, SOB.  Past Medical History:  Diagnosis Date  . Chest pain   . Headache   . Hematuria   . History of penile discharge     There are no active problems to display for this patient.   History reviewed. No pertinent surgical history.     Home Medications    Prior to Admission medications   Medication Sig Start Date End Date Taking? Authorizing Provider  butalbital-acetaminophen-caffeine (FIORICET, ESGIC) [id number] MG tablet Take 1-2 tablets by mouth every 6 (six) hours as needed for headache. 05/20/17   Trixie DredgeWest, Tyona Nilsen, PA-C  HYDROcodone-acetaminophen (NORCO/VICODIN) 5-325 MG tablet Take 1 tablet by mouth every 6 (six) hours as needed. 10/15/16   Tegeler, Canary Brimhristopher J, MD  hydrocortisone (ANUCORT-HC) 25 MG suppository Place 1 suppository (25 mg total) rectally 2 (two) times daily. Patient not taking: Reported on 10/15/2016 11/14/15   Joy, Ines BloomerShawn C, PA-C  lidocaine (LIDODERM) 5 % Place 1 patch onto the skin daily. Remove & Discard patch within 12 hours or as directed by MD 02/24/17   Trixie DredgeWest, Chioke Noxon, PA-C  methocarbamol (ROBAXIN) 500 MG tablet Take 1 tablet (500 mg total) by mouth 2 (two) times daily as needed for muscle spasms. 02/28/17   Everlene Farrieransie, William, PA-C  naproxen (NAPROSYN) 250 MG tablet Take 1 tablet  (250 mg total) by mouth 2 (two) times daily with a meal. 02/28/17   Everlene Farrieransie, William, PA-C    Family History History reviewed. No pertinent family history.  Social History Social History  Substance Use Topics  . Smoking status: Never Smoker  . Smokeless tobacco: Never Used  . Alcohol use No     Allergies   Patient has no known allergies.   Review of Systems Review of Systems  All other systems reviewed and are negative.    Physical Exam Updated Vital Signs BP 120/84   Pulse 68   Temp 98.3 F (36.8 C)   Resp 16   Ht 5\' 11"  (1.803 m)   Wt 90.7 kg (200 lb)   SpO2 97%   BMI 27.89 kg/m   Physical Exam  Constitutional: He appears well-developed and well-nourished. No distress.  HENT:  Head: Normocephalic and atraumatic.  Neck: Normal range of motion. Neck supple.  Cardiovascular: Normal rate and regular rhythm.   Pulmonary/Chest: Effort normal and breath sounds normal. No respiratory distress. He has no wheezes. He has no rales.  Abdominal: Soft. He exhibits no distension and no mass. There is no tenderness. There is no rebound and no guarding.  Neurological: He is alert. He exhibits normal muscle tone.  CN II-XII intact, EOMs intact, no pronator drift, grip strengths equal bilaterally; strength 5/5 in all extremities, sensation intact in all extremities; finger to nose, heel to shin, rapid alternating  movements normal; gait is normal.     Skin: He is not diaphoretic.  Nursing note and vitals reviewed.    ED Treatments / Results  Labs (all labs ordered are listed, but only abnormal results are displayed) Labs Reviewed  BASIC METABOLIC PANEL - Abnormal; Notable for the following:       Result Value   Glucose, Bld 100 (*)    All other components within normal limits  CBC WITH DIFFERENTIAL/PLATELET    EKG  EKG Interpretation None       Radiology Ct Head Wo Contrast  Result Date: 05/20/2017 CLINICAL DATA:  Acute headache persisting for 1 week EXAM: CT HEAD  WITHOUT CONTRAST TECHNIQUE: Contiguous axial images were obtained from the base of the skull through the vertex without intravenous contrast. COMPARISON:  None available FINDINGS: Brain: No evidence of acute infarction, hemorrhage, hydrocephalus, extra-axial collection or mass lesion/mass effect. Vascular: No hyperdense vessel or unexpected calcification. Skull: Normal. Negative for fracture or focal lesion. Sinuses/Orbits: No acute finding. Other: None. IMPRESSION: Normal head CT without contrast Electronically Signed   By: Judie Petit.  Shick M.D.   On: 05/20/2017 13:05    Procedures Procedures (including critical care time)  Medications Ordered in ED Medications  sodium chloride 0.9 % bolus 1,000 mL (0 mLs Intravenous Stopped 05/20/17 1400)  ketorolac (TORADOL) 30 MG/ML injection 30 mg (30 mg Intravenous Given 05/20/17 1233)  prochlorperazine (COMPAZINE) injection 10 mg (10 mg Intravenous Given 05/20/17 1235)  diphenhydrAMINE (BENADRYL) injection 25 mg (25 mg Intravenous Given 05/20/17 1230)  butalbital-acetaminophen-caffeine (FIORICET, ESGIC) 50-325-40 MG per tablet 2 tablet (2 tablets Oral Given 05/20/17 1339)     Initial Impression / Assessment and Plan / ED Course  I have reviewed the triage vital signs and the nursing notes.  Pertinent labs & imaging results that were available during my care of the patient were reviewed by me and considered in my medical decision making (see chart for details).    Afebrile, nontoxic patient with chronic daily headache.  No red flags.  No nuchal rigidity.  Discussed with Dr Particia Nearing.  Neurologic exam is normal.  CT head negative.  Labs reassuring.  Pt does increase HR with standing, advised hydration.   D/C home with symptomatic treatment, neurology, PCP follow up.  Discussed result, findings, treatment, and follow up  with patient.  Pt given return precautions.  Pt verbalizes understanding and agrees with plan.       Final Clinical Impressions(s) / ED Diagnoses    Final diagnoses:  Chronic nonintractable headache, unspecified headache type    New Prescriptions New Prescriptions   BUTALBITAL-ACETAMINOPHEN-CAFFEINE (FIORICET, ESGIC) 50-325-40 MG TABLET    Take 1-2 tablets by mouth every 6 (six) hours as needed for headache.     Trixie Dredge, PA-C 05/20/17 1433    Jacalyn Lefevre, MD 05/20/17 [phone]

## 2018-05-24 ENCOUNTER — Other Ambulatory Visit: Payer: Self-pay

## 2018-05-24 ENCOUNTER — Emergency Department (HOSPITAL_COMMUNITY)
Admission: EM | Admit: 2018-05-24 | Discharge: 2018-05-24 | Disposition: A | Discharge status: Home / Self Care | Payer: Self-pay | Attending: Emergency Medicine | Admitting: Emergency Medicine

## 2018-05-24 ENCOUNTER — Emergency Department (HOSPITAL_COMMUNITY): Payer: Self-pay

## 2018-05-24 ENCOUNTER — Encounter (HOSPITAL_COMMUNITY): Payer: Self-pay

## 2018-05-24 DIAGNOSIS — Z79899 Other long term (current) drug therapy: Secondary | ICD-10-CM | POA: Insufficient documentation

## 2018-05-24 DIAGNOSIS — N132 Hydronephrosis with renal and ureteral calculous obstruction: Secondary | ICD-10-CM | POA: Insufficient documentation

## 2018-05-24 LAB — LIPASE, BLOOD: LIPASE: 26 U/L (ref 11–51)

## 2018-05-24 LAB — COMPREHENSIVE METABOLIC PANEL
ALK PHOS: 79 U/L (ref 38–126)
ALT: 20 U/L (ref 0–44)
ANION GAP: 9 (ref 5–15)
AST: 19 U/L (ref 15–41)
Albumin: 4.6 g/dL (ref 3.5–5.0)
BILIRUBIN TOTAL: 0.8 mg/dL (ref 0.3–1.2)
BUN: 12 mg/dL (ref 6–20)
CALCIUM: 9.7 mg/dL (ref 8.9–10.3)
CO2: 28 mmol/L (ref 22–32)
Chloride: 101 mmol/L (ref 98–111)
Creatinine, Ser: 1.48 mg/dL — ABNORMAL HIGH (ref 0.61–1.24)
GFR calc non Af Amer: 56 mL/min — ABNORMAL LOW (ref >60)
Glucose, Bld: 143 mg/dL — ABNORMAL HIGH (ref 70–99)
Potassium: 3.7 mmol/L (ref 3.5–5.1)
Sodium: 138 mmol/L (ref 135–145)
TOTAL PROTEIN: 8 g/dL (ref 6.5–8.1)

## 2018-05-24 LAB — URINALYSIS, ROUTINE W REFLEX MICROSCOPIC
BILIRUBIN URINE: NEGATIVE
Glucose, UA: NEGATIVE mg/dL
Ketones, ur: NEGATIVE mg/dL
Leukocytes, UA: NEGATIVE
NITRITE: NEGATIVE
PROTEIN: NEGATIVE mg/dL
SPECIFIC GRAVITY, URINE: 1.017 (ref 1.005–1.030)
pH: 6 (ref 5.0–8.0)

## 2018-05-24 LAB — CBC
HCT: 44.4 % (ref 39.0–52.0)
HEMOGLOBIN: 14.5 g/dL (ref 13.0–17.0)
MCH: 29.9 pg (ref 26.0–34.0)
MCHC: 32.7 g/dL (ref 30.0–36.0)
MCV: 91.5 fL (ref 78.0–100.0)
Platelets: 334 10*3/uL (ref 150–400)
RBC: 4.85 MIL/uL (ref 4.22–5.81)
RDW: 12.1 % (ref 11.5–15.5)
WBC: 8 10*3/uL (ref 4.0–10.5)

## 2018-05-24 MED ORDER — HYDROMORPHONE HCL 1 MG/ML IJ SOLN
0.5000 mg | INTRAMUSCULAR | Status: DC | PRN
  Administered 2018-05-24: 0.5 mg via INTRAVENOUS
  Filled 2018-05-24: qty 1

## 2018-05-24 MED ORDER — TAMSULOSIN HCL 0.4 MG PO CAPS: 0.4000 mg | ORAL_CAPSULE | Freq: Every day | ORAL | 0 refills | Status: DC

## 2018-05-24 MED ORDER — HYDROCODONE-ACETAMINOPHEN 5-325 MG PO TABS: 1.0000 | ORAL_TABLET | Freq: Four times a day (QID) | ORAL | 0 refills | Status: DC | PRN

## 2018-05-24 MED ORDER — NAPROXEN 500 MG PO TABS: 500.0000 mg | ORAL_TABLET | Freq: Two times a day (BID) | ORAL | 0 refills | Status: DC

## 2018-05-24 MED ORDER — KETOROLAC TROMETHAMINE 30 MG/ML IJ SOLN
30.0000 mg | Freq: Once | INTRAMUSCULAR | Status: AC
  Administered 2018-05-24: 30 mg via INTRAVENOUS
  Filled 2018-05-24: qty 1

## 2018-05-24 MED ORDER — ONDANSETRON HCL 4 MG/2ML IJ SOLN
4.0000 mg | Freq: Once | INTRAMUSCULAR | Status: AC
  Administered 2018-05-24: 4 mg via INTRAVENOUS
  Filled 2018-05-24: qty 2

## 2018-05-24 MED ORDER — SODIUM CHLORIDE 0.9 % IV BOLUS
1000.0000 mL | Freq: Once | INTRAVENOUS | Status: AC
  Administered 2018-05-24: 1000 mL via INTRAVENOUS

## 2018-05-24 MED ORDER — SODIUM CHLORIDE 0.9 % IV SOLN
INTRAVENOUS | Status: DC
  Administered 2018-05-24: 09:00:00 via INTRAVENOUS

## 2018-05-24 NOTE — ED Provider Notes (Signed)
[facility]-EMERGENCY DEPT Provider Note   CSN: [id number] Arrival date & time: 05/24/18  0865     History   Chief Complaint Chief Complaint  Patient presents with  . Abdominal Pain  . Nausea    HPI Lance Gomez is a 42 y.o. male.  Pt admits to drinking a lot of alcohol over the weekend.  He did multiple shots.  He does not drink like this usually.  The history is provided by the patient.  Abdominal Pain   This is a new problem. The current episode started yesterday. The problem occurs constantly. Progression since onset: worsening. Associated with: not associated with eating, some increase with deep breathing and movement. The pain is located in the RUQ. The quality of the pain is sharp. The pain is severe. Associated symptoms include nausea. Pertinent negatives include fever, vomiting and dysuria. The symptoms are aggravated by deep breathing and certain positions. Nothing relieves the symptoms.    Past Medical History:  Diagnosis Date  . Chest pain   . Headache   . Hematuria   . History of penile discharge     There are no active problems to display for this patient.   History reviewed. No pertinent surgical history.      Home Medications    Prior to Admission medications   Medication Sig Start Date End Date Taking? Authorizing Provider  butalbital-acetaminophen-caffeine (FIORICET, ESGIC) 50-325-40 MG tablet Take 1-2 tablets by mouth every 6 (six) hours as needed for headache. Patient not taking: Reported on 05/24/2018 05/20/17   Trixie Dredge, PA-C  HYDROcodone-acetaminophen (NORCO/VICODIN) 5-325 MG tablet Take 1 tablet by mouth every 6 (six) hours as needed. 05/24/18   Linwood Dibbles, MD  hydrocortisone (ANUCORT-HC) 25 MG suppository Place 1 suppository (25 mg total) rectally 2 (two) times daily. Patient not taking: Reported on 10/15/2016 11/14/15   Joy, Ines Bloomer C, PA-C  lidocaine (LIDODERM) 5 % Place 1 patch onto the skin daily. Remove & Discard patch  within 12 hours or as directed by MD Patient not taking: Reported on 05/24/2018 02/24/17   Trixie Dredge, PA-C  methocarbamol (ROBAXIN) 500 MG tablet Take 1 tablet (500 mg total) by mouth 2 (two) times daily as needed for muscle spasms. Patient not taking: Reported on 05/24/2018 02/28/17   Everlene Farrier, PA-C  naproxen (NAPROSYN) 500 MG tablet Take 1 tablet (500 mg total) by mouth 2 (two) times daily with a meal. As needed for pain 05/24/18   Linwood Dibbles, MD  tamsulosin (FLOMAX) 0.4 MG CAPS capsule Take 1 capsule (0.4 mg total) by mouth daily. 05/24/18   Linwood Dibbles, MD    Family History Family History  Problem Relation Age of Onset  . Rheum arthritis Mother   . Hypertension Mother   . Hiatal hernia Mother     Social History Social History   Tobacco Use  . Smoking status: Never Smoker  . Smokeless tobacco: Never Used  Substance Use Topics  . Alcohol use: Yes    Comment: occasionally  . Drug use: No     Allergies   Patient has no known allergies.   Review of Systems Review of Systems  Constitutional: Negative for fever.  Gastrointestinal: Positive for abdominal pain and nausea. Negative for vomiting.  Genitourinary: Negative for dysuria.  All other systems reviewed and are negative.    Physical Exam Updated Vital Signs BP (!) 128/95   Pulse 68   Temp 98 F (36.7 C) (Oral)   Resp 14   Ht 1.803  m (5\' 11" )   Wt 92.1 kg (203 lb)   SpO2 100%   BMI 28.31 kg/m   Physical Exam  Constitutional: He appears well-developed and well-nourished. No distress.  HENT:  Head: Normocephalic and atraumatic.  Right Ear: External ear normal.  Left Ear: External ear normal.  Eyes: Conjunctivae are normal. Right eye exhibits no discharge. Left eye exhibits no discharge. No scleral icterus.  Neck: Neck supple. No tracheal deviation present.  Cardiovascular: Normal rate, regular rhythm and intact distal pulses.  Pulmonary/Chest: Effort normal and breath sounds normal. No stridor. No respiratory  distress. He has no wheezes. He has no rales.  Abdominal: Soft. Bowel sounds are normal. He exhibits no distension. There is tenderness in the right upper quadrant. There is no rebound and no guarding.  Musculoskeletal: He exhibits no edema or tenderness.  Neurological: He is alert. He has normal strength. No sensory deficit. Cranial nerve deficit: no gross deficits. He exhibits normal muscle tone. He displays no seizure activity. Coordination normal.  Skin: Skin is warm and dry. No rash noted.  Psychiatric: He has a normal mood and affect.  Nursing note and vitals reviewed.    ED Treatments / Results  Labs (all labs ordered are listed, but only abnormal results are displayed) Labs Reviewed  COMPREHENSIVE METABOLIC PANEL - Abnormal; Notable for the following components:      Result Value   Glucose, Bld 143 (*)    Creatinine, Ser 1.48 (*)    GFR calc non Af Amer 56 (*)    All other components within normal limits  URINALYSIS, ROUTINE W REFLEX MICROSCOPIC - Abnormal; Notable for the following components:   Hgb urine dipstick LARGE (*)    Bacteria, UA RARE (*)    All other components within normal limits  URINE CULTURE  LIPASE, BLOOD  CBC    EKG None  Radiology Dg Chest 2 View  Result Date: 05/24/2018 CLINICAL DATA:  Right mid and upper abdominal pain and nausea beginning yesterday. No chest complaints. Nonsmoker. EXAM: CHEST - 2 VIEW COMPARISON:  PA and lateral chest x-ray of October 15, 2016. FINDINGS: The lungs are adequately inflated and clear. The heart and pulmonary vascularity are normal. The mediastinum is normal in width. There is no pleural effusion. The bony thorax is unremarkable. IMPRESSION: There is no active cardiopulmonary disease. The observed portion of the upper abdomen is unremarkable. Electronically Signed   By: David  SwazilandJordan M.D.   On: 05/24/2018 11:30   Ct Renal Stone Study  Result Date: 05/24/2018 CLINICAL DATA:  RIGHT upper and mid abdominal pain with  nausea since yesterday, RIGHT flank pain, suspected stone disease EXAM: CT ABDOMEN AND PELVIS WITHOUT CONTRAST TECHNIQUE: Multidetector CT imaging of the abdomen and pelvis was performed following the standard protocol without IV contrast. Sagittal and coronal MPR images reconstructed from axial data set. Oral contrast not administered for this indication. COMPARISON:  None FINDINGS: Lower chest: Bibasilar atelectasis Hepatobiliary: Gallbladder and liver normal appearance Pancreas: Normal appearance Spleen: Normal appearance Adrenals/Urinary Tract: Adrenal glands normal appearance. BILATERAL nonobstructing renal calculi. Additionally, RIGHT hydronephrosis secondary to a 4 mm RIGHT UPJ calculus. Subtle low-attenuation foci in both kidneys question cysts. Ureters decompressed. Bladder unremarkable. Stomach/Bowel: Normal appendix. Stomach and bowel loops normal appearance for technique. Vascular/Lymphatic: Scattered pelvic phleboliths. Aorta normal caliber. No adenopathy. Reproductive: Minimal prostatic enlargement. Other: No free air or free fluid.  No definite inflammatory process. Musculoskeletal: Straightening of lumbar lordosis. No other osseous abnormalities. IMPRESSION: 4 mm RIGHT UPJ calculus with  mild RIGHT hydronephrosis. Additional BILATERAL nonobstructing renal calculi. Question small BILATERAL renal cysts Electronically Signed   By: Ulyses Southward M.D.   On: 05/24/2018 12:13    Procedures Procedures (including critical care time)  Medications Ordered in ED Medications  sodium chloride 0.9 % bolus 1,000 mL (1,000 mLs Intravenous New Bag/Given 05/24/18 0920)    And  0.9 %  sodium chloride infusion ( Intravenous New Bag/Given 05/24/18 0920)  HYDROmorphone (DILAUDID) injection 0.5 mg (0.5 mg Intravenous Given 05/24/18 0919)  ondansetron (ZOFRAN) injection 4 mg (4 mg Intravenous Given 05/24/18 0919)  ketorolac (TORADOL) 30 MG/ML injection 30 mg (30 mg Intravenous Given 05/24/18 1239)     Initial Impression  / Assessment and Plan / ED Course  I have reviewed the triage vital signs and the nursing notes.  Pertinent labs & imaging results that were available during my care of the patient were reviewed by me and considered in my medical decision making (see chart for details).  Clinical Course as of May 24 1258  Mon May 24, 2018  1253 Pt is felling much better now.  CT scan confirms a uteral stone.  Labs reviewed. UA shows som bacteria in the urine but doubt infection clincally.  Will send off a culture.   [JK]    Clinical Course User Index [JK] Linwood Dibbles, MD    Patient presented to the emergency room for evaluation of acute right flank pain.  His ED work-up is consistent with a kidney stone.  4 mm stone noted on the CT scan.  Patient is afebrile.  Laboratory tests are otherwise reassuring.  Patient is feeling much better now.  Plan on discharge home with outpatient follow-up.  Final Clinical Impressions(s) / ED Diagnoses   Final diagnoses:  Ureteral stone with hydronephrosis    ED Discharge Orders        Ordered    naproxen (NAPROSYN) 500 MG tablet  2 times daily with meals     05/24/18 1257    HYDROcodone-acetaminophen (NORCO/VICODIN) 5-325 MG tablet  Every 6 hours PRN     05/24/18 1257    tamsulosin (FLOMAX) 0.4 MG CAPS capsule  Daily     05/24/18 1257       Linwood Dibbles, MD 05/24/18 1300

## 2018-05-24 NOTE — ED Notes (Signed)
Pt provided urinal. Aware UA needed.

## 2018-05-24 NOTE — ED Triage Notes (Signed)
patient c/o right mid and upper abdominal pain and nausea since yesterday.

## 2018-05-24 NOTE — Discharge Instructions (Signed)
Take the medications as needed for pain, follow-up with urologist for further evaluation

## 2018-05-26 LAB — URINE CULTURE: Culture: NO GROWTH

## 2018-07-31 ENCOUNTER — Emergency Department (HOSPITAL_COMMUNITY): Payer: Self-pay

## 2018-07-31 ENCOUNTER — Encounter (HOSPITAL_COMMUNITY): Payer: Self-pay | Admitting: Emergency Medicine

## 2018-07-31 ENCOUNTER — Emergency Department (HOSPITAL_COMMUNITY)
Admission: EM | Admit: 2018-07-31 | Discharge: 2018-07-31 | Disposition: A | Discharge status: Home / Self Care | Payer: Self-pay | Attending: Emergency Medicine | Admitting: Emergency Medicine

## 2018-07-31 DIAGNOSIS — Z79899 Other long term (current) drug therapy: Secondary | ICD-10-CM | POA: Insufficient documentation

## 2018-07-31 DIAGNOSIS — N200 Calculus of kidney: Secondary | ICD-10-CM

## 2018-07-31 DIAGNOSIS — N132 Hydronephrosis with renal and ureteral calculous obstruction: Secondary | ICD-10-CM | POA: Insufficient documentation

## 2018-07-31 LAB — BASIC METABOLIC PANEL
ANION GAP: 6 (ref 5–15)
BUN: 11 mg/dL (ref 6–20)
CALCIUM: 9.1 mg/dL (ref 8.9–10.3)
CO2: 27 mmol/L (ref 22–32)
CREATININE: 1.68 mg/dL — AB (ref 0.61–1.24)
Chloride: 109 mmol/L (ref 98–111)
GFR, EST AFRICAN AMERICAN: 56 mL/min — AB (ref >60)
GFR, EST NON AFRICAN AMERICAN: 48 mL/min — AB (ref >60)
GLUCOSE: 95 mg/dL (ref 70–99)
Potassium: 4.1 mmol/L (ref 3.5–5.1)
Sodium: 142 mmol/L (ref 135–145)

## 2018-07-31 LAB — CBC
HCT: 42.7 % (ref 39.0–52.0)
HEMOGLOBIN: 14.1 g/dL (ref 13.0–17.0)
MCH: 30.2 pg (ref 26.0–34.0)
MCHC: 33 g/dL (ref 30.0–36.0)
MCV: 91.4 fL (ref 78.0–100.0)
PLATELETS: 332 10*3/uL (ref 150–400)
RBC: 4.67 MIL/uL (ref 4.22–5.81)
RDW: 12.2 % (ref 11.5–15.5)
WBC: 8.7 10*3/uL (ref 4.0–10.5)

## 2018-07-31 LAB — URINALYSIS, ROUTINE W REFLEX MICROSCOPIC
BACTERIA UA: NONE SEEN
BILIRUBIN URINE: NEGATIVE
GLUCOSE, UA: NEGATIVE mg/dL
KETONES UR: NEGATIVE mg/dL
Nitrite: NEGATIVE
PROTEIN: NEGATIVE mg/dL
Specific Gravity, Urine: 1.023 (ref 1.005–1.030)
pH: 7 (ref 5.0–8.0)

## 2018-07-31 MED ORDER — HYDROCODONE-ACETAMINOPHEN 5-325 MG PO TABS: 1.0000 | ORAL_TABLET | Freq: Four times a day (QID) | ORAL | 0 refills | Status: DC | PRN

## 2018-07-31 MED ORDER — TAMSULOSIN HCL 0.4 MG PO CAPS: 0.4000 mg | ORAL_CAPSULE | Freq: Every day | ORAL | 0 refills | Status: DC

## 2018-07-31 MED ORDER — SODIUM CHLORIDE 0.9 % IV BOLUS
1000.0000 mL | Freq: Once | INTRAVENOUS | Status: AC
  Administered 2018-07-31: 1000 mL via INTRAVENOUS

## 2018-07-31 MED ORDER — MORPHINE SULFATE (PF) 4 MG/ML IV SOLN
4.0000 mg | Freq: Once | INTRAVENOUS | Status: AC
  Administered 2018-07-31: 4 mg via INTRAVENOUS
  Filled 2018-07-31: qty 1

## 2018-07-31 MED ORDER — ONDANSETRON HCL 4 MG/2ML IJ SOLN
4.0000 mg | Freq: Once | INTRAMUSCULAR | Status: AC
  Administered 2018-07-31: 4 mg via INTRAVENOUS
  Filled 2018-07-31: qty 2

## 2018-07-31 MED ORDER — GI COCKTAIL ~~LOC~~
30.0000 mL | Freq: Once | ORAL | Status: AC
  Administered 2018-07-31: 30 mL via ORAL
  Filled 2018-07-31: qty 30

## 2018-07-31 NOTE — Discharge Instructions (Signed)
Your pain is likely due to kidney stone on the right side.  Use urine strainer when urinate and capture the stone for urologist to analyze.  Return if you have any concerns.

## 2018-07-31 NOTE — ED Triage Notes (Signed)
Patient here from home with complaints of right flank pain x3days. Seen here for some. Hx of kidney stones. No nausea, vomiting.

## 2018-07-31 NOTE — ED Provider Notes (Signed)
[facility]-EMERGENCY DEPT Provider Note   CSN: [id number] Arrival date & time: 07/31/18  1330     History   Chief Complaint Chief Complaint  Patient presents with  . Flank Pain    HPI Lance Gomez is a 42 y.o. male.  The history is provided by the patient, the spouse and medical records. No language interpreter was used.  Flank Pain      42 year old male with known history of kidney stones presenting complaining of flank pain.  Patient report for more than a week he has had recurrent pain to his right flank which spread throughout his abdomen.  Pain intensified for the past 3 days.  Described pain as a burning stabbing sensation, nothing seems to make it better or worse.  Pain is 6 out of 10 with mild nausea.  He noticed when urinating his urine stream is not straight like normal.  He felt pain is less intense than his kidney stones in the past.  There is no associated fever, chills, productive cough, dysuria, bowel bladder changes or rash.  He is tries over-the-counter medication at home as well as leftover medication from previous kidney stone without relief.  Past Medical History:  Diagnosis Date  . Chest pain   . Headache   . Hematuria   . History of penile discharge     There are no active problems to display for this patient.   History reviewed. No pertinent surgical history.      Home Medications    Prior to Admission medications   Medication Sig Start Date End Date Taking? Authorizing Provider  butalbital-acetaminophen-caffeine (FIORICET, ESGIC) 50-325-40 MG tablet Take 1-2 tablets by mouth every 6 (six) hours as needed for headache. Patient not taking: Reported on 05/24/2018 05/20/17   Trixie Dredge, PA-C  HYDROcodone-acetaminophen (NORCO/VICODIN) 5-325 MG tablet Take 1 tablet by mouth every 6 (six) hours as needed. 05/24/18   Linwood Dibbles, MD  hydrocortisone (ANUCORT-HC) 25 MG suppository Place 1 suppository (25 mg total) rectally 2 (two) times  daily. Patient not taking: Reported on 10/15/2016 11/14/15   Joy, Ines Bloomer C, PA-C  lidocaine (LIDODERM) 5 % Place 1 patch onto the skin daily. Remove & Discard patch within 12 hours or as directed by MD Patient not taking: Reported on 05/24/2018 02/24/17   Trixie Dredge, PA-C  methocarbamol (ROBAXIN) 500 MG tablet Take 1 tablet (500 mg total) by mouth 2 (two) times daily as needed for muscle spasms. Patient not taking: Reported on 05/24/2018 02/28/17   Everlene Farrier, PA-C  naproxen (NAPROSYN) 500 MG tablet Take 1 tablet (500 mg total) by mouth 2 (two) times daily with a meal. As needed for pain 05/24/18   Linwood Dibbles, MD  tamsulosin (FLOMAX) 0.4 MG CAPS capsule Take 1 capsule (0.4 mg total) by mouth daily. 05/24/18   Linwood Dibbles, MD    Family History Family History  Problem Relation Age of Onset  . Rheum arthritis Mother   . Hypertension Mother   . Hiatal hernia Mother     Social History Social History   Tobacco Use  . Smoking status: Never Smoker  . Smokeless tobacco: Never Used  Substance Use Topics  . Alcohol use: Yes    Comment: occasionally  . Drug use: No     Allergies   Patient has no known allergies.   Review of Systems Review of Systems  Genitourinary: Positive for flank pain.  All other systems reviewed and are negative.    Physical Exam Updated Vital  Signs BP 135/86 (BP Location: Left Arm)   Pulse 71   Temp 97.8 F (36.6 C) (Oral)   Resp 18   Wt 97.1 kg   SpO2 99%   BMI 29.85 kg/m   Physical Exam  Constitutional: He appears well-developed and well-nourished. No distress.  HENT:  Head: Atraumatic.  Eyes: Conjunctivae are normal.  Neck: Neck supple.  Cardiovascular: Normal rate and regular rhythm.  Pulmonary/Chest: Effort normal and breath sounds normal.  Abdominal: Soft. Bowel sounds are normal. He exhibits no distension. There is tenderness (Diffuse abdominal tenderness without focal point tenderness.  No guarding or rebound tenderness.).  Genitourinary:    Genitourinary Comments: No CVA tenderness  Neurological: He is alert.  Skin: No rash noted.  Psychiatric: He has a normal mood and affect.  Nursing note and vitals reviewed.    ED Treatments / Results  Labs (all labs ordered are listed, but only abnormal results are displayed) Labs Reviewed  URINALYSIS, ROUTINE W REFLEX MICROSCOPIC - Abnormal; Notable for the following components:      Result Value   APPearance CLOUDY (*)    Hgb urine dipstick MODERATE (*)    Leukocytes, UA TRACE (*)    All other components within normal limits  BASIC METABOLIC PANEL - Abnormal; Notable for the following components:   Creatinine, Ser 1.68 (*)    GFR calc non Af Amer 48 (*)    GFR calc Af Amer 56 (*)    All other components within normal limits  CBC    EKG None  Radiology US Renal  Result Date: 07/31/2018 CLINICAL DATA:  42 year old male with acute flank pain. EXAM: RENAL / URINARY TRACT ULTRASOUND COMPLETE COMPARISON:  05/24/2018 CT FINDINGS: Right Kidney: Length: 11.7 cm. Mild to moderate RIGHT hydronephrosis noted. The proximal RIGHT ureter is dilated but the remainder the RIGHT ureter is not well visualized due to bowel gas. A 1.2 cm UPPER pole cyst is present. No solid mass identified. Left Kidney: Length: 11.8 cm. Echogenicity within normal limits. No mass or hydronephrosis visualized. Bladder: Appears normal for degree of bladder distention. IMPRESSION: 1. Mild to moderate RIGHT hydronephrosis likely from an obstructing calculus, which is not visualized on this study. 2. Unremarkable LEFT kidney. Electronically Signed   By: Harmon Pier M.D.   On: 07/31/2018 18:36    Procedures Procedures (including critical care time)  Medications Ordered in ED Medications  morphine 4 MG/ML injection 4 mg (4 mg Intravenous Given 07/31/18 1801)  ondansetron (ZOFRAN) injection 4 mg (4 mg Intravenous Given 07/31/18 1801)  sodium chloride 0.9 % bolus 1,000 mL (0 mLs Intravenous Stopped 07/31/18 1927)  gi  cocktail (Maalox,Lidocaine,Donnatal) (30 mLs Oral Given 07/31/18 1952)     Initial Impression / Assessment and Plan / ED Course  I have reviewed the triage vital signs and the nursing notes.  Pertinent labs & imaging results that were available during my care of the patient were reviewed by me and considered in my medical decision making (see chart for details).     BP 135/86 (BP Location: Left Arm)   Pulse 71   Temp 97.8 F (36.6 C) (Oral)   Resp 18   Wt 97.1 kg   SpO2 99%   BMI 29.85 kg/m    Final Clinical Impressions(s) / ED Diagnoses   Final diagnoses:  Kidney stone on right side    ED Discharge Orders         Ordered    HYDROcodone-acetaminophen (NORCO/VICODIN) 5-325 MG tablet  Every 6  hours PRN     07/31/18 2044    tamsulosin (FLOMAX) 0.4 MG CAPS capsule  Daily     07/31/18 2044         5:22 PM Patient with history of kidney stone here with right flank pain abdominal pain.  He does not have any significant CVA tenderness on exam however he is diffusely tender in his abdomen.  UA shows moderate amount of hemoglobin and urine dipsticks which I suspect may be related to kidney stone.  Will obtain renal ultrasound, however it is normal, will consider abdominal pelvic CT scan for further evaluation.  8:50 PM Evidence of renal insufficiency with creatinine 1.68.  Patient received IV fluid.  Urine shows 11-20 WBC however patient denies any burning on urination and therefore I have low suspicion for UTI complicating his symptoms.  Leukocyte is normal.  Renal ultrasound demonstrate mild to moderate right hydronephrosis likely from his obstructive calculus which is not visualized in the study.  Unremarkable left kidney.  Given this finding I suspect his pain is likely due to an obstructive kidney stone.  Patient received IV fluid and his symptom is well controlled at this time.  Patient discharged home with Flomax and Vicodin as well as urology follow-up. In order to decrease risk  of narcotic abuse. Pt's record were checked using the [place] Controlled Substance database.     Fayrene Helper, PA-C 07/31/18 2052    Pricilla Loveless, MD 08/01/18 [phone]

## 2018-11-15 ENCOUNTER — Emergency Department (HOSPITAL_COMMUNITY): Payer: Self-pay

## 2018-11-15 ENCOUNTER — Other Ambulatory Visit: Payer: Self-pay

## 2018-11-15 ENCOUNTER — Encounter (HOSPITAL_COMMUNITY): Payer: Self-pay

## 2018-11-15 ENCOUNTER — Emergency Department (HOSPITAL_COMMUNITY)
Admission: EM | Admit: 2018-11-15 | Discharge: 2018-11-15 | Disposition: A | Discharge status: Home / Self Care | Payer: Self-pay | Attending: Emergency Medicine | Admitting: Emergency Medicine

## 2018-11-15 DIAGNOSIS — S40011A Contusion of right shoulder, initial encounter: Secondary | ICD-10-CM

## 2018-11-15 DIAGNOSIS — Y929 Unspecified place or not applicable: Secondary | ICD-10-CM | POA: Insufficient documentation

## 2018-11-15 DIAGNOSIS — Y999 Unspecified external cause status: Secondary | ICD-10-CM | POA: Insufficient documentation

## 2018-11-15 DIAGNOSIS — W19XXXA Unspecified fall, initial encounter: Secondary | ICD-10-CM

## 2018-11-15 DIAGNOSIS — W010XXA Fall on same level from slipping, tripping and stumbling without subsequent striking against object, initial encounter: Secondary | ICD-10-CM | POA: Insufficient documentation

## 2018-11-15 DIAGNOSIS — Y939 Activity, unspecified: Secondary | ICD-10-CM | POA: Insufficient documentation

## 2018-11-15 MED ORDER — IBUPROFEN 200 MG PO TABS
600.0000 mg | ORAL_TABLET | Freq: Once | ORAL | Status: AC
  Administered 2018-11-15: 600 mg via ORAL
  Filled 2018-11-15: qty 3

## 2018-11-15 MED ORDER — CYCLOBENZAPRINE HCL 10 MG PO TABS: 10.0000 mg | ORAL_TABLET | Freq: Two times a day (BID) | ORAL | 0 refills | Status: AC | PRN

## 2018-11-15 MED ORDER — NAPROXEN 500 MG PO TABS: 500.0000 mg | ORAL_TABLET | Freq: Two times a day (BID) | ORAL | 0 refills | Status: AC

## 2018-11-15 NOTE — Discharge Instructions (Addendum)
Do not drive while taking the muscle relaxer as it will make you sleepy. Follow up with your doctor or return here as needed.  °

## 2018-11-15 NOTE — ED Triage Notes (Signed)
Patient c/o neck and right arm pain. Patient fell today around 10:40 am. Patient states he slipped on water and right foot went forward and tried to catch himself with right arm.  Denies LOC.  7/10 sore pain   Ambulatory in triage A/Ox4

## 2018-11-15 NOTE — ED Provider Notes (Signed)
[facility]-EMERGENCY DEPT Provider Note   CSN: [id number] Arrival date & time: 11/15/18  1321     History   Chief Complaint Chief Complaint  Patient presents with  . Fall  . Neck Pain  . Arm Pain    HPI Lance Gomez is a 42 y.o. male who presents to the ED s/p fall that occurred this morning. Patient reports he slipped on water and his right foot went forward. He tried to catch himself with his right arm but fell on his arm. Patient c/o right side neck and right shoulder pain. Patient denies lOC or head injury.   HPI  Past Medical History:  Diagnosis Date  . Chest pain   . Headache   . Hematuria   . History of penile discharge     There are no active problems to display for this patient.   History reviewed. No pertinent surgical history.      Home Medications    Prior to Admission medications   Medication Sig Start Date End Date Taking? Authorizing Provider  cyclobenzaprine (FLEXERIL) 10 MG tablet Take 1 tablet (10 mg total) by mouth 2 (two) times daily as needed for muscle spasms. 11/15/18   Janne NapoleonNeese, Nicholos Aloisi M, NP  naproxen (NAPROSYN) 500 MG tablet Take 1 tablet (500 mg total) by mouth 2 (two) times daily. 11/15/18   Janne NapoleonNeese, Dalia Jollie M, NP    Family History Family History  Problem Relation Age of Onset  . Rheum arthritis Mother   . Hypertension Mother   . Hiatal hernia Mother     Social History Social History   Tobacco Use  . Smoking status: Never Smoker  . Smokeless tobacco: Never Used  Substance Use Topics  . Alcohol use: Yes    Comment: occasionally  . Drug use: No     Allergies   Patient has no known allergies.   Review of Systems Review of Systems  Musculoskeletal: Positive for arthralgias.  Skin: Negative for wound.  All other systems reviewed and are negative.    Physical Exam Updated Vital Signs BP (!) 128/97 (BP Location: Left Arm)   Pulse 72   Temp (!) 97.4 F (36.3 C) (Oral)   Resp 16   Ht 5\' 11"  (1.803  m)   Wt 90.7 kg   SpO2 100%   BMI 27.89 kg/m   Physical Exam Vitals signs and nursing note reviewed.  Constitutional:      General: He is not in acute distress.    Appearance: Normal appearance. He is well-developed.  HENT:     Head: Normocephalic and atraumatic.     Right Ear: Tympanic membrane normal.     Left Ear: Tympanic membrane normal.     Nose: Nose normal.     Mouth/Throat:     Pharynx: Oropharynx is clear.  Eyes:     Extraocular Movements: Extraocular movements intact.     Conjunctiva/sclera: Conjunctivae normal.  Neck:     Musculoskeletal: Normal range of motion and neck supple. Muscular tenderness present. No spinous process tenderness (right side).  Cardiovascular:     Rate and Rhythm: Normal rate.  Pulmonary:     Effort: Pulmonary effort is normal.  Abdominal:     Palpations: Abdomen is soft.     Tenderness: There is no abdominal tenderness.  Musculoskeletal:     Right shoulder: He exhibits tenderness, pain and spasm. He exhibits no effusion, no crepitus, no deformity, normal pulse and normal strength. Decreased range of motion: due to pain.  Comments: Radial pulses 2+  Skin:    General: Skin is warm and dry.  Neurological:     General: No focal deficit present.     Mental Status: He is alert and oriented to person, place, and time.     Cranial Nerves: No cranial nerve deficit.     Gait: Gait normal.     Comments: Grips are equal.  Psychiatric:        Mood and Affect: Mood normal.      ED Treatments / Results  Labs (all labs ordered are listed, but only abnormal results are displayed) Labs Reviewed - No data to display Radiology Dg Shoulder Right  Result Date: 11/15/2018 CLINICAL DATA:  Status post fall EXAM: RIGHT SHOULDER - 2+ VIEW COMPARISON:  None. FINDINGS: There is no evidence of fracture or dislocation. There is no evidence of arthropathy or other focal bone abnormality. Soft tissues are unremarkable. IMPRESSION: No acute osseous injury  of the right shoulder. Electronically Signed   By: Elige KoHetal  Patel   On: 11/15/2018 16:59    Procedures Procedures (including critical care time)  Medications Ordered in ED Medications  ibuprofen (ADVIL,MOTRIN) tablet 600 mg (600 mg Oral Given 11/15/18 1616)     Initial Impression / Assessment and Plan / ED Course  I have reviewed the triage vital signs and the nursing notes. 42 y.o. male here with right side neck pain and right shoulder pain s/p fall stable for d/c without fracture or dislocation noted on x-ray and no focal neuro deficits. Will treat for contusion and patient to f/u with PCP or return here as needed.   Final Clinical Impressions(s) / ED Diagnoses   Final diagnoses:  Fall, initial encounter  Contusion of right shoulder, initial encounter    ED Discharge Orders         Ordered    naproxen (NAPROSYN) 500 MG tablet  2 times daily     11/15/18 1708    cyclobenzaprine (FLEXERIL) 10 MG tablet  2 times daily PRN     11/15/18 1708           Damian Leavelleese, HobergHope M, NP 11/15/18 2002    Lorre NickAllen, Anthony, MD 11/16/18 [phone]

## 2020-05-06 ENCOUNTER — Encounter (HOSPITAL_COMMUNITY): Payer: Self-pay

## 2020-05-06 ENCOUNTER — Emergency Department (HOSPITAL_COMMUNITY): Payer: 59

## 2020-05-06 ENCOUNTER — Emergency Department (HOSPITAL_COMMUNITY)
Admission: EM | Admit: 2020-05-06 | Discharge: 2020-05-06 | Disposition: A | Discharge status: Home / Self Care | Payer: 59 | Attending: Emergency Medicine | Admitting: Emergency Medicine

## 2020-05-06 DIAGNOSIS — S59912A Unspecified injury of left forearm, initial encounter: Secondary | ICD-10-CM | POA: Diagnosis present

## 2020-05-06 DIAGNOSIS — Y9301 Activity, walking, marching and hiking: Secondary | ICD-10-CM | POA: Insufficient documentation

## 2020-05-06 DIAGNOSIS — S52255A Nondisplaced comminuted fracture of shaft of ulna, left arm, initial encounter for closed fracture: Secondary | ICD-10-CM | POA: Diagnosis not present

## 2020-05-06 DIAGNOSIS — S52202A Unspecified fracture of shaft of left ulna, initial encounter for closed fracture: Secondary | ICD-10-CM

## 2020-05-06 DIAGNOSIS — Y92414 Local residential or business street as the place of occurrence of the external cause: Secondary | ICD-10-CM | POA: Diagnosis not present

## 2020-05-06 DIAGNOSIS — Y999 Unspecified external cause status: Secondary | ICD-10-CM | POA: Insufficient documentation

## 2020-05-06 DIAGNOSIS — S161XXA Strain of muscle, fascia and tendon at neck level, initial encounter: Secondary | ICD-10-CM

## 2020-05-06 MED ORDER — HYDROCODONE-ACETAMINOPHEN 5-325 MG PO TABS: 1.0000 | ORAL_TABLET | Freq: Four times a day (QID) | ORAL | 0 refills | Status: AC | PRN

## 2020-05-06 NOTE — ED Notes (Signed)
Paged Ortho Tech 

## 2020-05-06 NOTE — Progress Notes (Signed)
Orthopedic Tech Progress Note Patient Details:  Lance Gomez 26-Jun-1976 [id number]  Ortho Devices Type of Ortho Device: Arm sling, Sugartong splint Ortho Device/Splint Location: lue Ortho Device/Splint Interventions: Ordered, Application, Adjustment   Post Interventions Patient Tolerated: Well Instructions Provided: Care of device, Adjustment of device   Trinna Post 05/06/2020, 4:39 AM
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
## 2020-05-06 NOTE — ED Notes (Signed)
Patient verbalizes understanding of discharge instructions. Opportunity for questioning and answers were provided. Armband removed by staff, pt discharged from ED. Pt. ambulatory and discharged home.  

## 2020-05-06 NOTE — ED Notes (Signed)
Ortho Tech at bedside.  

## 2020-05-06 NOTE — Discharge Instructions (Addendum)
Wear splint as applied for the next several days.  Follow-up with orthopedics in the next 2 to 3 days.  The contact information for Dr. Roney Mans has been provided in this discharge summary for you to call and make these arrangements.  Take hydrocodone as prescribed as needed for pain.  Ice your arm 20 minutes every 2 hours while awake for the next 2 days.

## 2020-05-06 NOTE — ED Notes (Signed)
Pt transported to XRay 

## 2020-05-07 ENCOUNTER — Encounter (HOSPITAL_COMMUNITY): Payer: Self-pay

## 2022-02-18 ENCOUNTER — Encounter (HOSPITAL_COMMUNITY): Payer: Self-pay

## 2022-02-18 ENCOUNTER — Ambulatory Visit (HOSPITAL_COMMUNITY)
Admission: EM | Admit: 2022-02-18 | Discharge: 2022-02-18 | Disposition: A | Discharge status: Home / Self Care | Payer: 59 | Attending: Family Medicine | Admitting: Family Medicine

## 2022-02-18 DIAGNOSIS — R519 Headache, unspecified: Secondary | ICD-10-CM | POA: Diagnosis not present

## 2022-02-18 MED ORDER — SUMATRIPTAN SUCCINATE 6 MG/0.5ML ~~LOC~~ SOLN
6.0000 mg | Freq: Once | SUBCUTANEOUS | Status: AC
  Administered 2022-02-18: 6 mg via SUBCUTANEOUS

## 2022-02-18 MED ORDER — SUMATRIPTAN SUCCINATE 6 MG/0.5ML ~~LOC~~ SOLN
SUBCUTANEOUS | Status: AC
  Filled 2022-02-18: qty 0.5

## 2022-02-18 MED ORDER — IBUPROFEN 800 MG PO TABS: 800.0000 mg | ORAL_TABLET | Freq: Three times a day (TID) | ORAL | 0 refills | Status: AC

## 2022-02-18 MED ORDER — KETOROLAC TROMETHAMINE 30 MG/ML IJ SOLN
INTRAMUSCULAR | Status: AC
  Filled 2022-02-18: qty 1

## 2022-02-18 MED ORDER — KETOROLAC TROMETHAMINE 30 MG/ML IJ SOLN
30.0000 mg | Freq: Once | INTRAMUSCULAR | Status: AC
  Administered 2022-02-18: 30 mg via INTRAMUSCULAR

## 2022-02-18 MED ORDER — DEXAMETHASONE SODIUM PHOSPHATE 10 MG/ML IJ SOLN
INTRAMUSCULAR | Status: AC
  Filled 2022-02-18: qty 1

## 2022-02-18 MED ORDER — DEXAMETHASONE SODIUM PHOSPHATE 10 MG/ML IJ SOLN
10.0000 mg | Freq: Once | INTRAMUSCULAR | Status: AC
  Administered 2022-02-18: 10 mg via INTRAMUSCULAR

## 2022-02-18 NOTE — ED Triage Notes (Signed)
Pt presents today with a headache that started yesterday. Pt stated he took Tylenol yesterday, but it has not alleviated the pain.  ?

## 2022-02-18 NOTE — ED Provider Notes (Signed)
?[facility] ? ? ? ?CSN: [id number] ?Arrival date & time: 02/18/22  1456 ? ? ?  ? ?History   ?Chief Complaint ?Chief Complaint  ?Patient presents with  ? Headache  ? ? ?HPI ?Rajat Staver is a 46 y.o. male.  ? ?Patient endorses generalized constant headache for 1 day.  Headache interfering with sleep.  Associated photophobia and nausea without vomiting.  Has attempted use of Tylenol which has been ineffective.  Denies dizziness, lightheadedness, generalized weakness, changes in speech or memory, fever, chills, URI symptoms.  Endorses prior to 1 day ago good sleeping habits with decreased caffeine intake.    ? ?Past Medical History:  ?Diagnosis Date  ? Chest pain   ? Headache   ? Hematuria   ? History of penile discharge   ? ? ?There are no problems to display for this patient. ? ? ?History reviewed. No pertinent surgical history. ? ? ? ? ?Home Medications   ? ?Prior to Admission medications   ?Medication Sig Start Date End Date Taking? Authorizing Provider  ?cyclobenzaprine (FLEXERIL) 10 MG tablet Take 1 tablet (10 mg total) by mouth 2 (two) times daily as needed for muscle spasms. 11/15/18   Janne Napoleon, NP  ?HYDROcodone-acetaminophen (NORCO) 5-325 MG tablet Take 1-2 tablets by mouth every 6 (six) hours as needed. 05/06/20   Geoffery Lyons, MD  ?naproxen (NAPROSYN) 500 MG tablet Take 1 tablet (500 mg total) by mouth 2 (two) times daily. 11/15/18   Janne Napoleon, NP  ? ? ?Family History ?Family History  ?Problem Relation Age of Onset  ? Rheum arthritis Mother   ? Hypertension Mother   ? Hiatal hernia Mother   ? ? ?Social History ?Social History  ? ?Tobacco Use  ? Smoking status: Never  ? Smokeless tobacco: Never  ?Vaping Use  ? Vaping Use: Never used  ?Substance Use Topics  ? Alcohol use: Yes  ?  Comment: occasionally  ? Drug use: No  ? ? ? ?Allergies   ?Patient has no known allergies. ? ? ?Review of Systems ?Review of Systems  ?Constitutional: Negative.   ?Eyes:  Positive for photophobia. Negative for  pain, discharge, redness, itching and visual disturbance.  ?Respiratory: Negative.    ?Cardiovascular: Negative.   ?Skin: Negative.   ?Neurological:  Positive for headaches. Negative for dizziness, tremors, seizures, syncope, facial asymmetry, speech difficulty, weakness, light-headedness and numbness.  ? ? ?Physical Exam ?Triage Vital Signs ?ED Triage Vitals  ?Enc Vitals Group  ?   BP 02/18/22 1618 110/69  ?   Pulse Rate 02/18/22 1618 98  ?   Resp 02/18/22 1618 20  ?   Temp 02/18/22 1618 98.7 ?F (37.1 ?C)  ?   Temp Source 02/18/22 1618 Oral  ?   SpO2 02/18/22 1618 96 %  ?   Weight --   ?   Height --   ?   Head Circumference --   ?   Peak Flow --   ?   Pain Score 02/18/22 1616 10  ?   Pain Loc --   ?   Pain Edu? --   ?   Excl. in GC? --   ? ?No data found. ? ?Updated Vital Signs ?BP 110/69 (BP Location: Right Arm)   Pulse 98   Temp 98.7 ?F (37.1 ?C) (Oral)   Resp 20   SpO2 96%  ? ?Visual Acuity ?Right Eye Distance:   ?Left Eye Distance:   ?Bilateral Distance:   ? ?Right Eye Near:   ?  Left Eye Near:    ?Bilateral Near:    ? ?Physical Exam ?Constitutional:   ?   Appearance: Normal appearance. He is well-developed.  ?HENT:  ?   Head: Normocephalic.  ?Eyes:  ?   Extraocular Movements: Extraocular movements intact.  ?Pulmonary:  ?   Effort: Pulmonary effort is normal.  ?Skin: ?   General: Skin is warm and dry.  ?Neurological:  ?   Mental Status: He is alert and oriented to person, place, and time. Mental status is at baseline.  ?   Cranial Nerves: No cranial nerve deficit.  ?   Sensory: No sensory deficit.  ?   Motor: No weakness.  ?   Coordination: Coordination normal.  ?Psychiatric:     ?   Mood and Affect: Mood normal.     ?   Behavior: Behavior normal.  ? ? ? ?UC Treatments / Results  ?Labs ?(all labs ordered are listed, but only abnormal results are displayed) ?Labs Reviewed - No data to display ? ?EKG ? ? ?Radiology ?No results found. ? ?Procedures ?Procedures (including critical care time) ? ?Medications  Ordered in UC ?Medications - No data to display ? ?Initial Impression / Assessment and Plan / UC Course  ?I have reviewed the triage vital signs and the nursing notes. ? ?Pertinent labs & imaging results that were available during my care of the patient were reviewed by me and considered in my medical decision making (see chart for details). ? ?Bad headache ? ?Vital signs are stable, patient is in no signs of distress, no abnormalities noted on neurological exam, will move forward with treatment of symptoms, Toradol, Imitrex and Decadron injection given in office, ibuprofen 800 prescription sent to pharmacy, recommended adequate rest and increase fluid intake through use of water to promote hydration, for persisting symptoms patient to follow-up with urgent care or with your primary doctor, given strict precautions for new or worsening headache to go to the nearest emergency department for evaluation, verbalized understanding ? ?Final Clinical Impressions(s) / UC Diagnoses  ? ?Final diagnoses:  ?None  ? ?Discharge Instructions   ?None ?  ? ?ED Prescriptions   ?None ?  ? ?PDMP not reviewed this encounter. ?  ?Valinda Hoar, NP ?02/18/22 1706 ? ?

## 2022-02-18 NOTE — Discharge Instructions (Signed)
Today you are being treated for headache ? ?You have been given an injection of Toradol, Decadron and Imitrex which all worked to reduce the pressure and inflammation that occurs when headaches are present which ideally in turn will reduce your pain ? ?Once home you may use ibuprofen 800 mg every 8 hours in addition to Tylenol to manage your symptoms ? ?Ensure that you are getting adequate rest as this will worsen your headache ? ?Ensure that you are drinking fluids and are staying hydrated as dehydration will worsen your headache, in addition decrease your salt intake ? ?If headaches continue to persist or recur please follow-up with urgent care or your primary doctor ? ?At any point if you feel you are experiencing the worst headache of your life please go to the nearest emergency department ?

## 2022-06-13 IMAGING — DX DG FOREARM 2V*L*
1 series · 2 of 2 positions shown · non-contrast
Comparison: None.

CLINICAL DATA: Pain

EXAM:
LEFT FOREARM - 2 VIEW

[Series 1: forearmbone · 0.14mm/px · 2 of 2 slices shown]
[im 1/2]
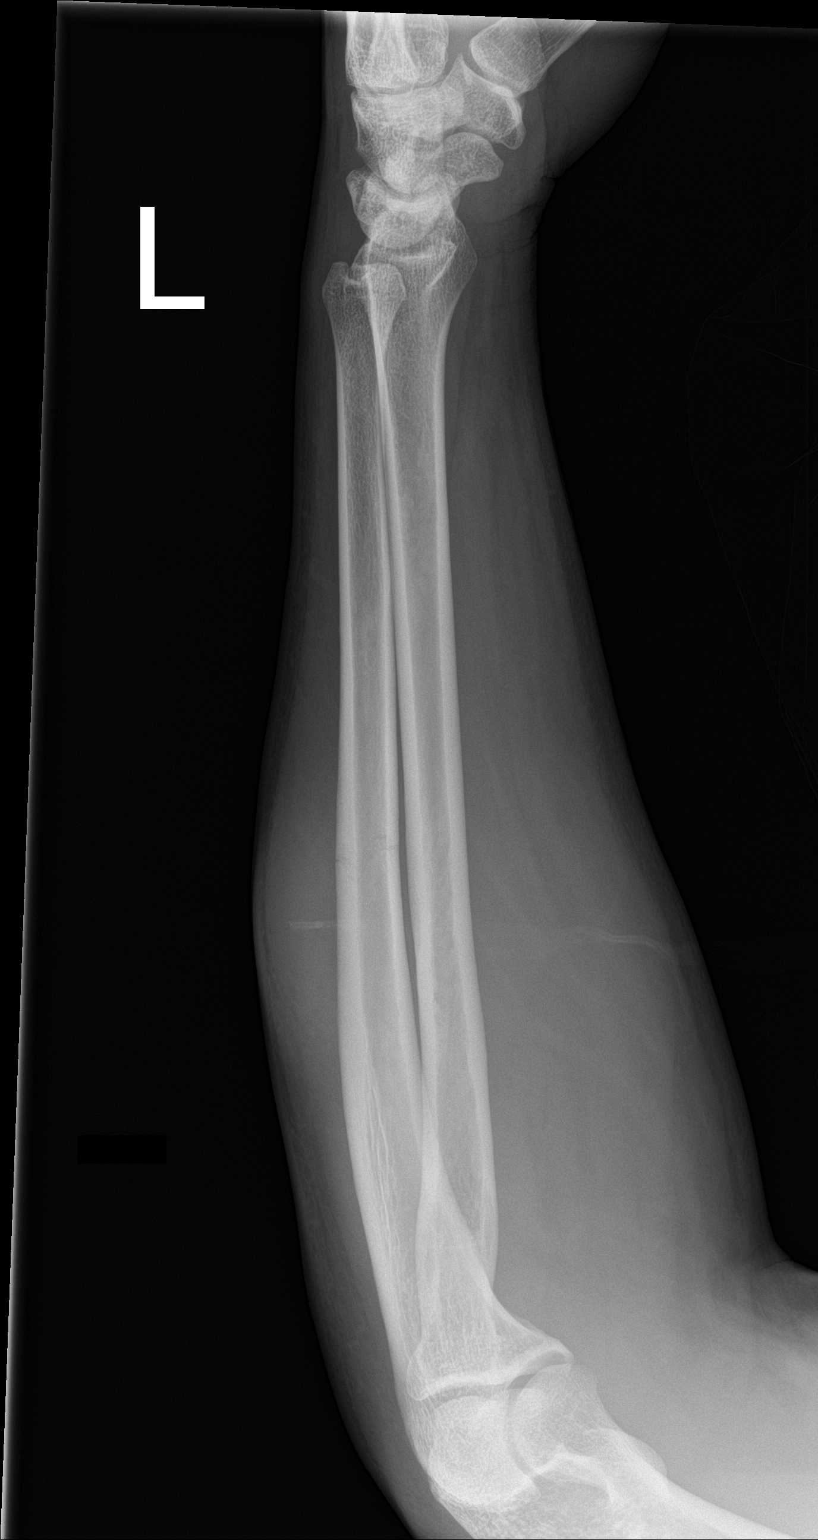
[im 2/2]
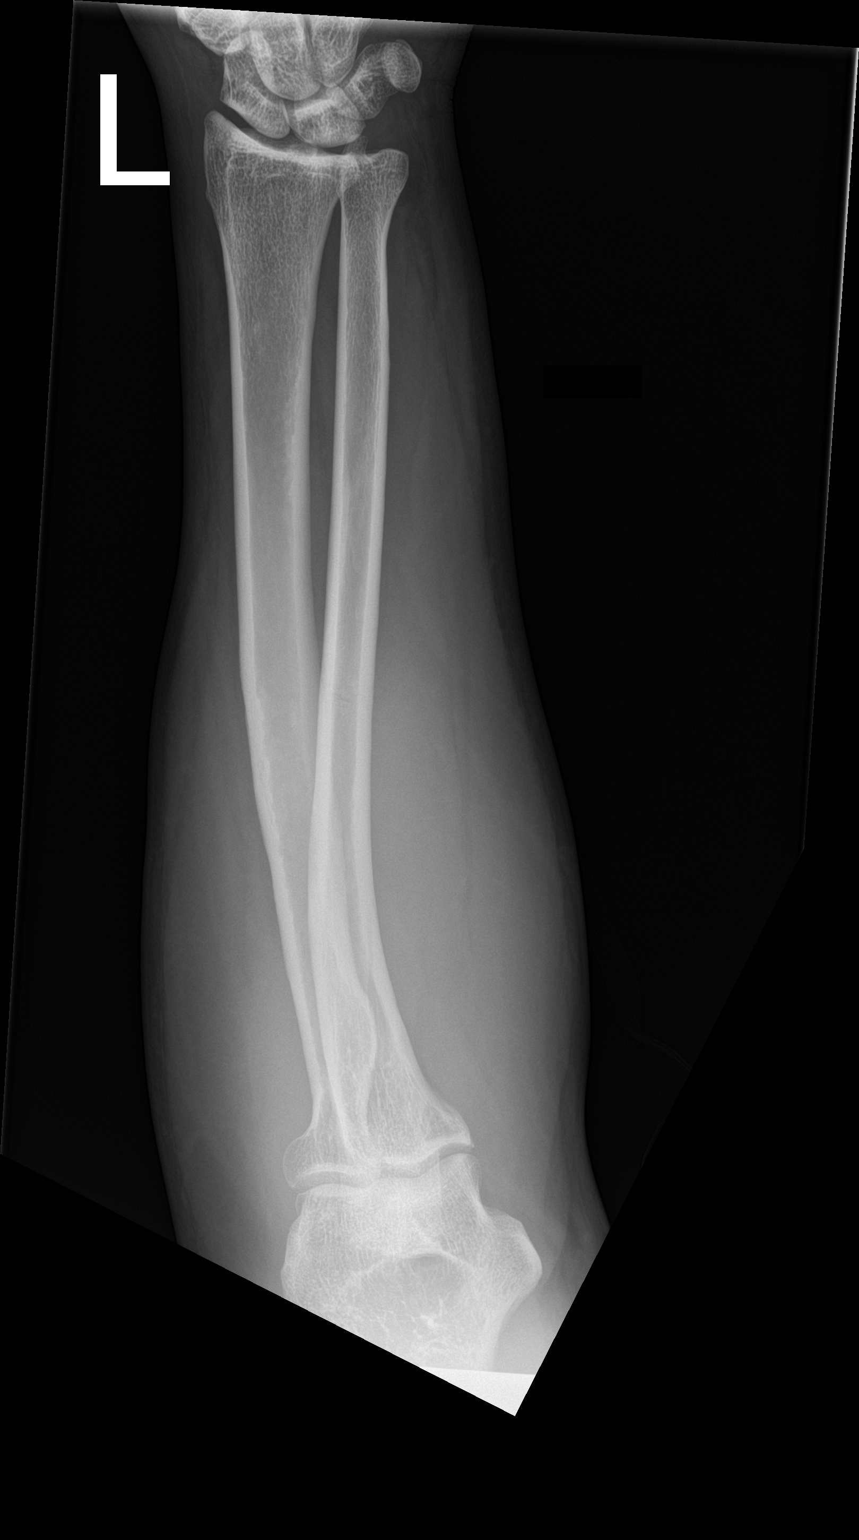

[2 of 2 positions shown; findings below may reference images not displayed]

FINDINGS: There is extensive soft tissue swelling about the forearm. There is
an acute nondisplaced fracture of the mid diaphysis the ulna. There
is no radiopaque foreign body. The elbow is not well evaluated on
this study.
IMPRESSION: 1. Acute nondisplaced fracture of the mid diaphysis of the ulna.
2. Extensive soft tissue swelling about the forearm.

## 2022-06-13 IMAGING — DX DG CERVICAL SPINE COMPLETE 4+V
5 series · 5 of 5 positions shown · non-contrast
Comparison: None.

CLINICAL DATA: Neck pain

EXAM:
CERVICAL SPINE - COMPLETE 4+ VIEW

[c-spine lat]
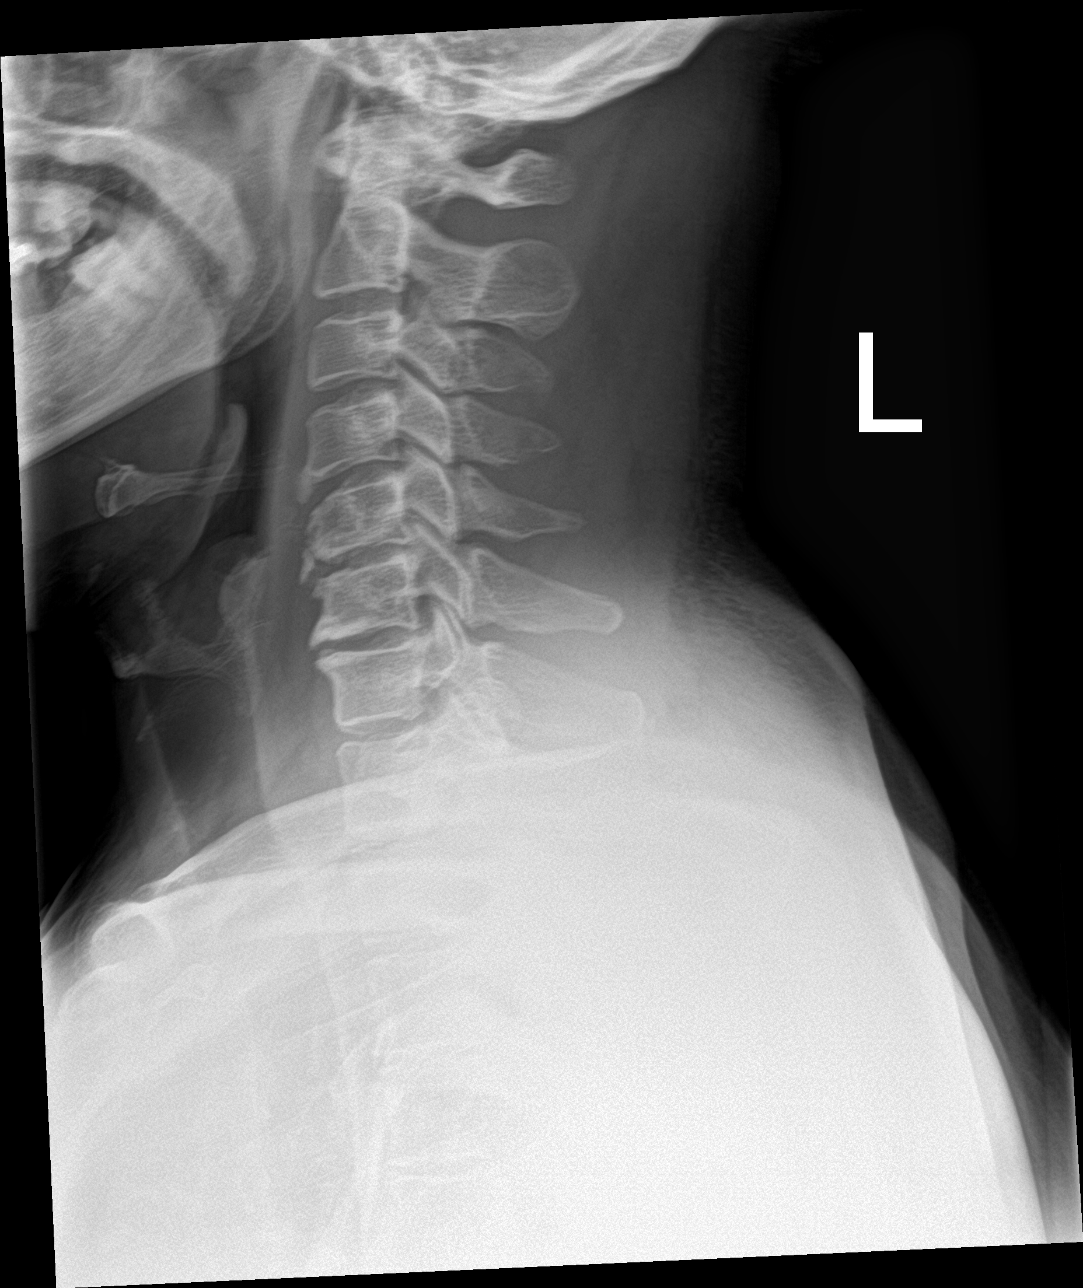

[c-spine obl (1 of 2)]
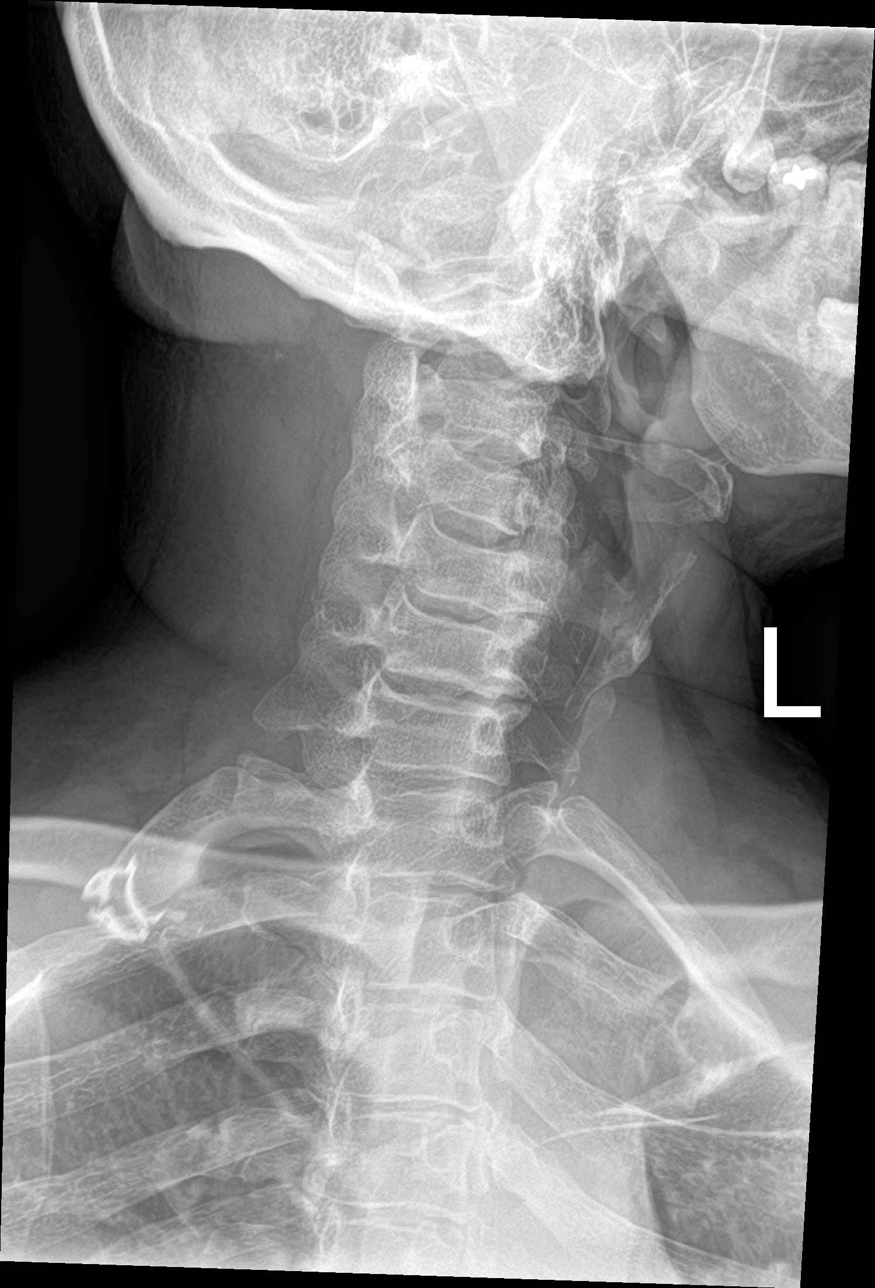

[c-spine obl (2 of 2)]
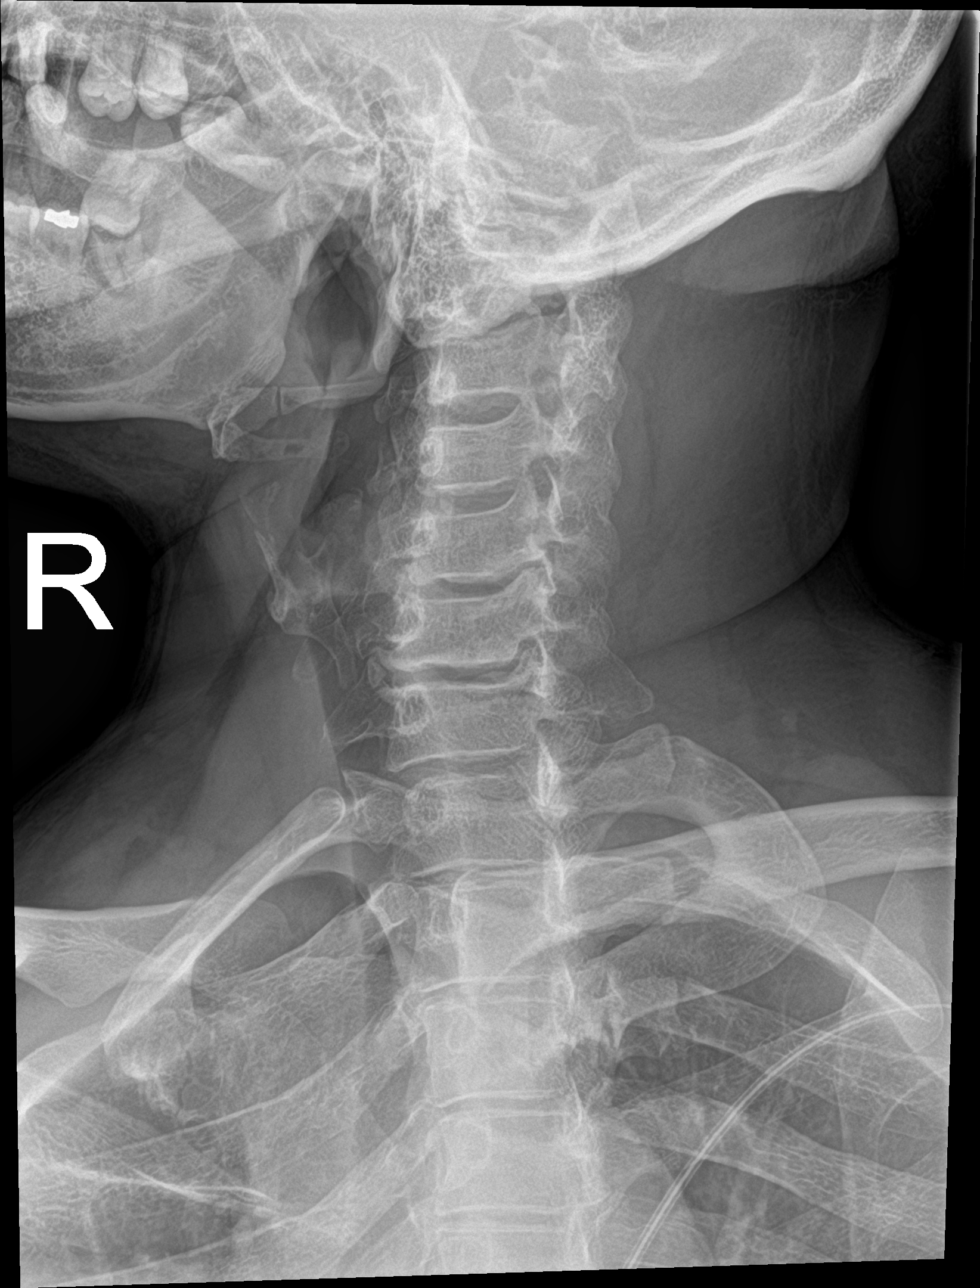

[c-spine ap]
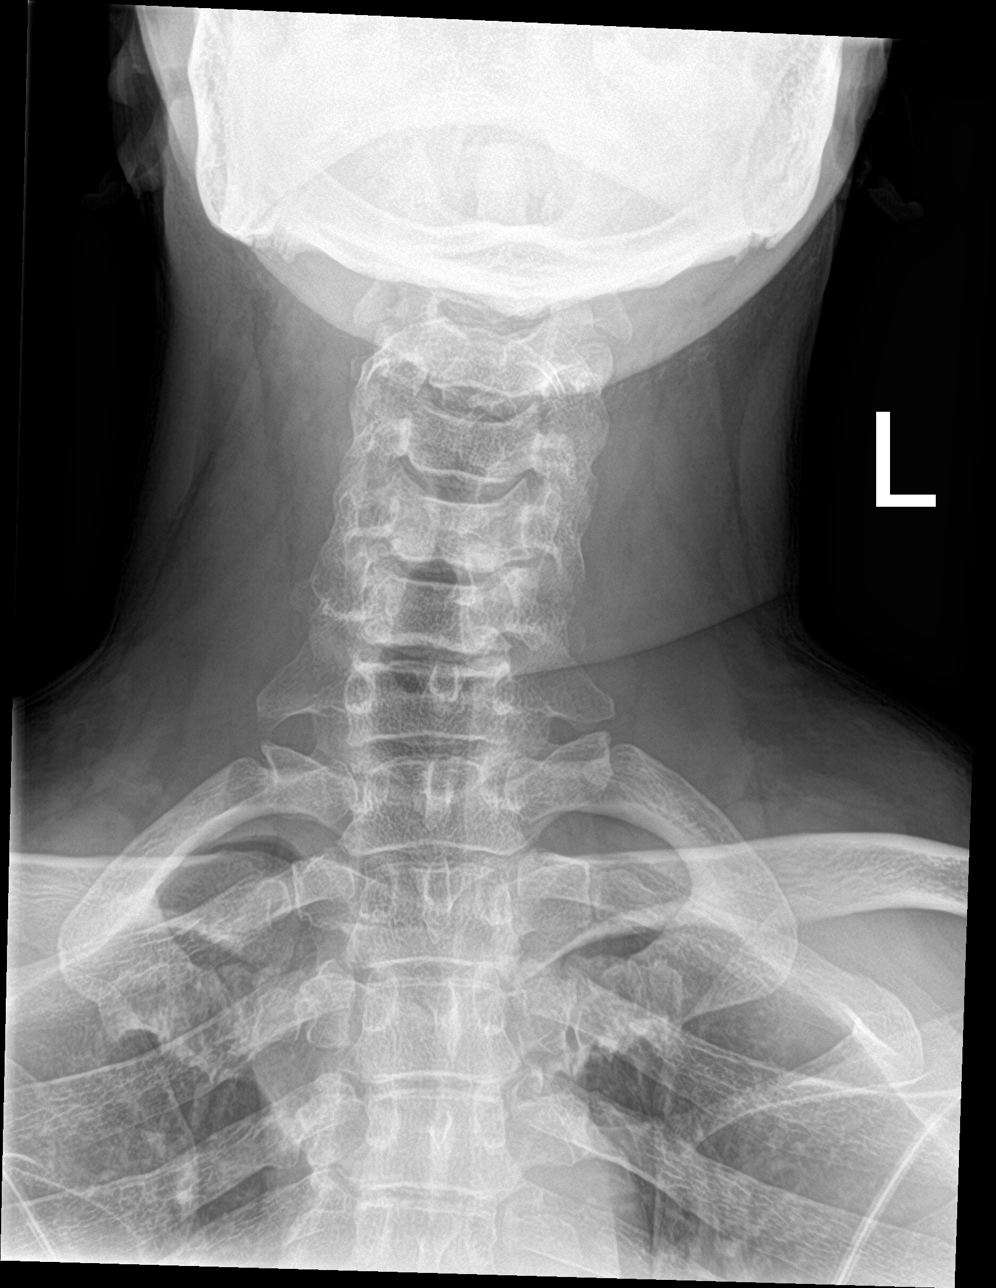

[c-spine open mouth]
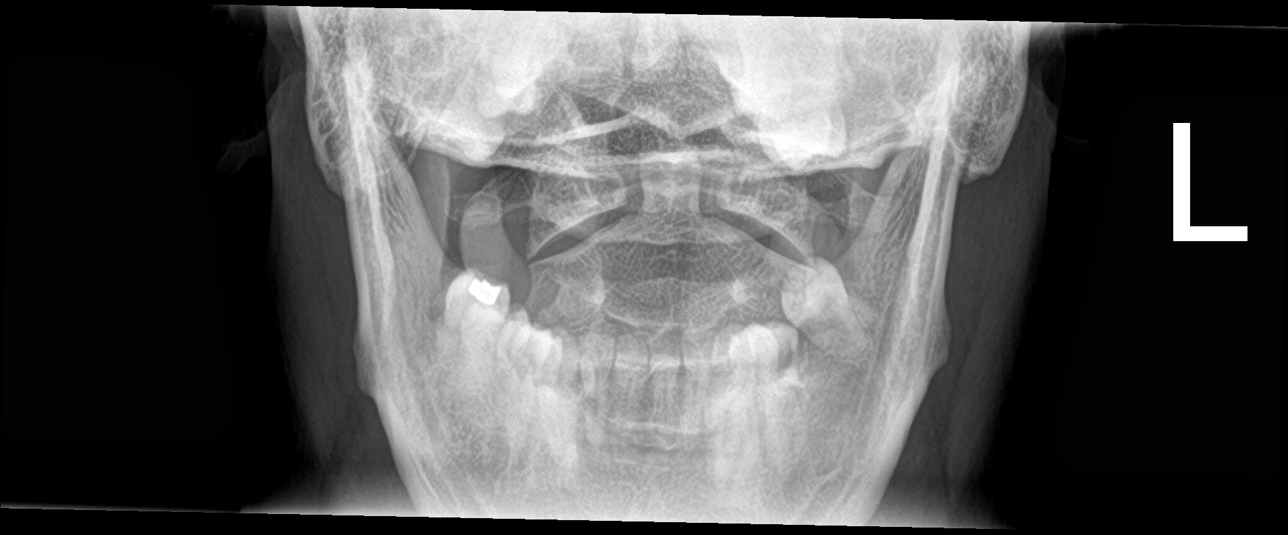

[5 of 5 positions shown; findings below may reference images not displayed]

FINDINGS: There is no evidence of cervical spine fracture or prevertebral soft
tissue swelling. Alignment is normal. No other significant bone
abnormalities are identified.
IMPRESSION: Negative cervical spine radiographs.

## 2022-12-18 DIAGNOSIS — R030 Elevated blood-pressure reading, without diagnosis of hypertension: Secondary | ICD-10-CM | POA: Diagnosis not present

## 2022-12-18 DIAGNOSIS — R69 Illness, unspecified: Secondary | ICD-10-CM | POA: Diagnosis not present

## 2022-12-18 DIAGNOSIS — Z125 Encounter for screening for malignant neoplasm of prostate: Secondary | ICD-10-CM | POA: Diagnosis not present

## 2022-12-18 DIAGNOSIS — R399 Unspecified symptoms and signs involving the genitourinary system: Secondary | ICD-10-CM | POA: Diagnosis not present

## 2022-12-18 DIAGNOSIS — Z113 Encounter for screening for infections with a predominantly sexual mode of transmission: Secondary | ICD-10-CM | POA: Diagnosis not present

## 2022-12-18 DIAGNOSIS — Z0000 Encounter for general adult medical examination without abnormal findings: Secondary | ICD-10-CM | POA: Diagnosis not present

## 2023-03-17 ENCOUNTER — Emergency Department (HOSPITAL_BASED_OUTPATIENT_CLINIC_OR_DEPARTMENT_OTHER)
Admission: EM | Admit: 2023-03-17 | Discharge: 2023-03-18 | Disposition: A | Discharge status: Home / Self Care | Payer: 59 | Attending: Emergency Medicine | Admitting: Emergency Medicine

## 2023-03-17 ENCOUNTER — Other Ambulatory Visit: Payer: Self-pay

## 2023-03-17 ENCOUNTER — Encounter (HOSPITAL_BASED_OUTPATIENT_CLINIC_OR_DEPARTMENT_OTHER): Payer: Self-pay

## 2023-03-17 DIAGNOSIS — B9789 Other viral agents as the cause of diseases classified elsewhere: Secondary | ICD-10-CM | POA: Diagnosis not present

## 2023-03-17 DIAGNOSIS — I10 Essential (primary) hypertension: Secondary | ICD-10-CM | POA: Insufficient documentation

## 2023-03-17 DIAGNOSIS — R519 Headache, unspecified: Secondary | ICD-10-CM | POA: Diagnosis not present

## 2023-03-17 DIAGNOSIS — J069 Acute upper respiratory infection, unspecified: Secondary | ICD-10-CM | POA: Diagnosis not present

## 2023-03-17 DIAGNOSIS — Z79899 Other long term (current) drug therapy: Secondary | ICD-10-CM | POA: Insufficient documentation

## 2023-03-17 DIAGNOSIS — Z1152 Encounter for screening for COVID-19: Secondary | ICD-10-CM | POA: Diagnosis not present

## 2023-03-17 LAB — RESP PANEL BY RT-PCR (RSV, FLU A&B, COVID)  RVPGX2
Influenza A by PCR: NEGATIVE
Influenza B by PCR: NEGATIVE
Resp Syncytial Virus by PCR: NEGATIVE
SARS Coronavirus 2 by RT PCR: NEGATIVE

## 2023-03-17 LAB — GROUP A STREP BY PCR: Group A Strep by PCR: NOT DETECTED

## 2023-03-17 MED ORDER — LORATADINE 10 MG PO TABS: 10.0000 mg | ORAL_TABLET | Freq: Every day | ORAL | 0 refills | Status: AC

## 2023-03-17 MED ORDER — FLUTICASONE PROPIONATE 50 MCG/ACT NA SUSP: 1.0000 | Freq: Every day | NASAL | 0 refills | Status: AC

## 2023-03-17 NOTE — ED Provider Notes (Signed)
[place] EMERGENCY DEPARTMENT AT [facility] Provider Note   CSN: [id number] Arrival date & time: 03/17/23  2056     History  Chief Complaint  Patient presents with   Neck Pain    Lance Gomez is a 47 y.o. male.  With history of hypertension who presents to the ED for evaluation of headache, otalgia, sore throat.  Symptoms began 4 to 5 days ago and have persisted.  He has not taken anything for his symptoms at home.  He reports an underwater muffled hearing sensation throughout that time as well.  He denies fevers, chills, difficulty swallowing, trismus, drooling.  He reports congestion and rhinorrhea as well.  He denies known sick contacts.  Denies history of allergies.  He presents today concerned for meningitis.  He has not had any fevers.  His right lateral neck hurts but it is not stiff.  He has not had an altered level of consciousness.  Headache is described as a headband distribution.  He does wear bilateral hearing aids and is wondering if this is contributed to his symptoms.  He denies chest pain, shortness of breath, cough, abdominal pain, nausea, vomiting, vision changes, numbness, weakness, tingling.   Neck Pain Associated symptoms: headaches        Home Medications Prior to Admission medications   Medication Sig Start Date End Date Taking? Authorizing Provider  fluticasone (FLONASE) 50 MCG/ACT nasal spray Place 1 spray into both nostrils daily. 03/17/23  Yes Arvetta Araque, Edsel Petrin, PA-C  loratadine (CLARITIN) 10 MG tablet Take 1 tablet (10 mg total) by mouth daily. 03/17/23  Yes Nyron Mozer, Edsel Petrin, PA-C  cyclobenzaprine (FLEXERIL) 10 MG tablet Take 1 tablet (10 mg total) by mouth 2 (two) times daily as needed for muscle spasms. 11/15/18   Janne Napoleon, NP  HYDROcodone-acetaminophen (NORCO) 5-325 MG tablet Take 1-2 tablets by mouth every 6 (six) hours as needed. 05/06/20   Geoffery Lyons, MD  ibuprofen (ADVIL) 800 MG tablet Take 1 tablet (800 mg total) by mouth 3  (three) times daily. 02/18/22   White, Elita Boone, NP  naproxen (NAPROSYN) 500 MG tablet Take 1 tablet (500 mg total) by mouth 2 (two) times daily. 11/15/18   Janne Napoleon, NP      Allergies    Patient has no known allergies.    Review of Systems   Review of Systems  HENT:  Positive for congestion, rhinorrhea and sore throat.   Musculoskeletal:  Positive for neck pain.  Neurological:  Positive for headaches.  All other systems reviewed and are negative.   Physical Exam Updated Vital Signs BP (!) 150/94 (BP Location: Right Arm)   Pulse 82   Temp 98.6 F (37 C) (Oral)   Resp 18   Ht  (1.803 m)   Wt 96.6 kg   SpO2 97%   BMI 29.71 kg/m  Physical Exam Vitals and nursing note reviewed.  Constitutional:      General: He is not in acute distress.    Appearance: Normal appearance. He is well-developed. He is not ill-appearing, toxic-appearing or diaphoretic.  HENT:     Head: Normocephalic and atraumatic.     Right Ear: Ear canal normal.     Left Ear: Ear canal normal.     Ears:     Comments: Bilateral serous effusions    Mouth/Throat:     Mouth: Mucous membranes are moist.     Pharynx: Oropharynx is clear. Posterior oropharyngeal erythema present. No oropharyngeal exudate.  Comments: Tonsils 1+ bilaterally.  Uvula midline.  No RPA or PTA.  No trismus.  Palate soft. Eyes:     Extraocular Movements: Extraocular movements intact.     Conjunctiva/sclera: Conjunctivae normal.     Pupils: Pupils are equal, round, and reactive to light.  Neck:     Comments: Negative Kernig's and Brudzinski's Cardiovascular:     Rate and Rhythm: Normal rate and regular rhythm.     Heart sounds: No murmur heard. Pulmonary:     Effort: Pulmonary effort is normal. No respiratory distress.     Breath sounds: Normal breath sounds. No wheezing, rhonchi or rales.  Abdominal:     Palpations: Abdomen is soft.     Tenderness: There is no abdominal tenderness. There is no guarding.   Musculoskeletal:        General: No swelling.     Cervical back: Normal range of motion and neck supple. No rigidity.  Skin:    General: Skin is warm and dry.     Capillary Refill: Capillary refill takes less than 2 seconds.  Neurological:     General: No focal deficit present.     Mental Status: He is alert and oriented to person, place, and time.  Psychiatric:        Mood and Affect: Mood normal.        Behavior: Behavior normal.     ED Results / Procedures / Treatments   Labs (all labs ordered are listed, but only abnormal results are displayed) Labs Reviewed  RESP PANEL BY RT-PCR (RSV, FLU A&B, COVID)  [id number]  GROUP A STREP BY PCR    EKG None  Radiology No results found.  Procedures Procedures    Medications Ordered in ED Medications - No data to display  ED Course/ Medical Decision Making/ A&P                             Medical Decision Making This patient presents to the ED for concern of headache, URI symptoms, this involves an extensive number of treatment options, and is a complaint that carries with it a high risk of complications and morbidity.  Emergent considerations for headache include subarachnoid hemorrhage, meningitis, temporal arteritis, glaucoma, cerebral ischemia, carotid/vertebral dissection, intracranial tumor, Venous sinus thrombosis, carbon monoxide poisoning, acute or chronic subdural hemorrhage.  Other considerations include: Migraine, Cluster headache, Hypertension, Caffeine, alcohol, or drug withdrawal, Pseudotumor cerebri, Arteriovenous malformation, Head injury, Neurocysticercosis, Post-lumbar puncture, Preeclampsia, Tension headache, Sinusitis, Cervical arthritis, Refractive error causing strain, Dental abscess, Otitis media, Temporomandibular joint syndrome, Depression, Somatoform disorder (eg, somatization) Trigeminal neuralgia, Glossopharyngeal neuralgia.  Co morbidities that complicate the patient evaluation  hypertension  My  initial workup includes respiratory panel, rapid strep  Additional history obtained from: Nursing notes from this visit.  I ordered, reviewed and interpreted labs which include: Respiratory panel, rapid strep.  Negative.  Afebrile, hemodynamically stable.  47 year old male presenting to the ED for evaluation of headache which is likely caused by his congestion.  He appears overall very well on exam.  He was concerned for meningitis, however he is afebrile, alert, fully oriented, and has no meningeal signs.  He has not taken anything for his symptoms.  Physical exam also reveals postnasal drip and bilateral serous effusions.  Patient likely has viral URI.  Respiratory panel and strep test negative in the ED.  Prescriptions for Claritin and Flonase were sent.  Patient was educated on proper use of Tylenol  and ibuprofen for body aches and fevers.  He was encouraged to follow-up with his primary care provider in 1 week for reevaluation of his symptoms.  He was educated on typical timeline of symptoms.  He was given return precautions.  Stable at discharge.  At this time there does not appear to be any evidence of an acute emergency medical condition and the patient appears stable for discharge with appropriate outpatient follow up. Diagnosis was discussed with patient who verbalizes understanding of care plan and is agreeable to discharge. I have discussed return precautions with patient who verbalizes understanding. Patient encouraged to follow-up with their PCP within 1 week. All questions answered.  Note: Portions of this report may have been transcribed using voice recognition software. Every effort was made to ensure accuracy; however, inadvertent computerized transcription errors may still be present.        Final Clinical Impression(s) / ED Diagnoses Final diagnoses:  Viral URI  Bad headache    Rx / DC Orders ED Discharge Orders          Ordered    loratadine (CLARITIN) 10 MG tablet   Daily        03/17/23 2350    fluticasone (FLONASE) 50 MCG/ACT nasal spray  Daily        03/17/23 2350              Michelle Piper, Cordelia Poche 03/17/23 2355    Ernie Avena, MD 03/21/23 1244

## 2023-03-17 NOTE — Discharge Instructions (Signed)
You have been seen today for your complaint of upper respiratory infection and headache. Your lab work was negative for flu, COVID, RSV and strep. Your discharge medications include Claritin and Flonase.  These prescriptions have been sent to your pharmacy.  Follow dosing instructions on the packaging. Alternate tylenol and ibuprofen for pain and fevers. You may alternate these every 4 hours. You may take up to 800 mg of ibuprofen at a time and up to 1000 mg of tylenol. Follow up with: Your primary care provider in 1 week for reevaluation of your symptoms Please seek immediate medical care if you develop any of the following symptoms: You have shortness of breath that gets worse. You have very bad or constant: Headache. Ear pain. Pain in your forehead, behind your eyes, and over your cheekbones (sinus pain). Chest pain. You have long-lasting (chronic) lung disease along with any of these: Making high-pitched whistling sounds when you breathe, most often when you breathe out (wheezing). Long-lasting cough (more than 14 days). Coughing up blood. A change in your usual mucus. You have a stiff neck. You have changes in your: Vision. Hearing. Thinking. Mood. At this time there does not appear to be the presence of an emergent medical condition, however there is always the potential for conditions to change. Please read and follow the below instructions.  Do not take your medicine if  develop an itchy rash, swelling in your mouth or lips, or difficulty breathing; call 911 and seek immediate emergency medical attention if this occurs.  You may review your lab tests and imaging results in their entirety on your MyChart account.  Please discuss all results of fully with your primary care provider and other specialist at your follow-up visit.  Note: Portions of this text may have been transcribed using voice recognition software. Every effort was made to ensure accuracy; however, inadvertent  computerized transcription errors may still be present.

## 2023-03-17 NOTE — ED Triage Notes (Signed)
Patient here POV from Home.  Endorses Headache for 4-5 Days. Consistent since it began. Also notes Sore Throat, Bilateral Otalgia, Lower Back Pain, Neck Pain (mostly all around).  Subjective fever. No N/V/D.   NAD Noted during Triage. A&Ox4. Gcs 15. Ambulatory.

## 2023-03-20 DIAGNOSIS — W57XXXA Bitten or stung by nonvenomous insect and other nonvenomous arthropods, initial encounter: Secondary | ICD-10-CM | POA: Diagnosis not present

## 2023-03-20 DIAGNOSIS — R21 Rash and other nonspecific skin eruption: Secondary | ICD-10-CM | POA: Diagnosis not present

## 2023-07-20 DIAGNOSIS — M5412 Radiculopathy, cervical region: Secondary | ICD-10-CM | POA: Diagnosis not present

## 2023-07-20 DIAGNOSIS — R200 Anesthesia of skin: Secondary | ICD-10-CM | POA: Diagnosis not present

## 2023-07-20 DIAGNOSIS — R202 Paresthesia of skin: Secondary | ICD-10-CM | POA: Diagnosis not present

## 2023-07-20 DIAGNOSIS — M4722 Other spondylosis with radiculopathy, cervical region: Secondary | ICD-10-CM | POA: Diagnosis not present

## 2023-07-20 DIAGNOSIS — I10 Essential (primary) hypertension: Secondary | ICD-10-CM | POA: Diagnosis not present

## 2023-07-20 DIAGNOSIS — M47812 Spondylosis without myelopathy or radiculopathy, cervical region: Secondary | ICD-10-CM | POA: Diagnosis not present
